# Patient Record
Sex: Female | Born: 1965 | Race: White | Hispanic: No | Smoking: Former smoker
Health system: Southern US, Community
[De-identification: ages and names within clinical notes are randomized; demographics above are authoritative.]

## PROBLEM LIST (undated history)

## (undated) DIAGNOSIS — E785 Hyperlipidemia, unspecified: Secondary | ICD-10-CM

## (undated) DIAGNOSIS — Z720 Tobacco use: Secondary | ICD-10-CM

## (undated) DIAGNOSIS — I493 Ventricular premature depolarization: Secondary | ICD-10-CM

## (undated) DIAGNOSIS — R600 Localized edema: Secondary | ICD-10-CM

## (undated) DIAGNOSIS — R002 Palpitations: Secondary | ICD-10-CM

## (undated) DIAGNOSIS — F17200 Nicotine dependence, unspecified, uncomplicated: Secondary | ICD-10-CM

## (undated) DIAGNOSIS — A630 Anogenital (venereal) warts: Secondary | ICD-10-CM

## (undated) DIAGNOSIS — I10 Essential (primary) hypertension: Secondary | ICD-10-CM

## (undated) HISTORY — PX: OTHER SURGICAL HISTORY: SHX169

## (undated) HISTORY — DX: Tobacco use: Z72.0

## (undated) HISTORY — DX: Localized edema: R60.0

## (undated) HISTORY — DX: Essential (primary) hypertension: I10

## (undated) HISTORY — DX: Hyperlipidemia, unspecified: E78.5

## (undated) HISTORY — DX: Nicotine dependence, unspecified, uncomplicated: F17.200

## (undated) HISTORY — PX: DILATION AND CURETTAGE OF UTERUS: SHX78

## (undated) HISTORY — DX: Anogenital (venereal) warts: A63.0

## (undated) HISTORY — DX: Palpitations: R00.2

## (undated) HISTORY — DX: Ventricular premature depolarization: I49.3

---

## 2000-09-05 ENCOUNTER — Other Ambulatory Visit: Admission: RE | Admit: 2000-09-05 | Discharge: 2000-09-05 | Payer: Self-pay | Admitting: *Deleted

## 2000-09-12 ENCOUNTER — Encounter: Admission: RE | Admit: 2000-09-12 | Discharge: 2000-09-12 | Payer: Self-pay | Admitting: *Deleted

## 2000-10-17 ENCOUNTER — Encounter: Admission: RE | Admit: 2000-10-17 | Discharge: 2000-10-17 | Payer: Self-pay | Admitting: *Deleted

## 2000-10-19 ENCOUNTER — Ambulatory Visit (HOSPITAL_BASED_OUTPATIENT_CLINIC_OR_DEPARTMENT_OTHER): Admission: RE | Admit: 2000-10-19 | Discharge: 2000-10-19 | Payer: Self-pay | Admitting: Otolaryngology

## 2001-08-04 ENCOUNTER — Ambulatory Visit (HOSPITAL_BASED_OUTPATIENT_CLINIC_OR_DEPARTMENT_OTHER): Admission: RE | Admit: 2001-08-04 | Discharge: 2001-08-04 | Payer: Self-pay | Admitting: Otolaryngology

## 2001-10-02 ENCOUNTER — Other Ambulatory Visit: Admission: RE | Admit: 2001-10-02 | Discharge: 2001-10-02 | Payer: Self-pay | Admitting: Family Medicine

## 2001-10-04 ENCOUNTER — Encounter: Payer: Self-pay | Admitting: Family Medicine

## 2001-10-04 ENCOUNTER — Encounter: Admission: RE | Admit: 2001-10-04 | Discharge: 2001-10-04 | Payer: Self-pay | Admitting: Family Medicine

## 2001-11-23 ENCOUNTER — Encounter: Payer: Self-pay | Admitting: Gastroenterology

## 2001-11-23 ENCOUNTER — Encounter: Admission: RE | Admit: 2001-11-23 | Discharge: 2001-11-23 | Payer: Self-pay | Admitting: Gastroenterology

## 2001-11-27 ENCOUNTER — Encounter: Payer: Self-pay | Admitting: Gastroenterology

## 2001-11-27 ENCOUNTER — Encounter: Admission: RE | Admit: 2001-11-27 | Discharge: 2001-11-27 | Payer: Self-pay | Admitting: Gastroenterology

## 2002-10-29 ENCOUNTER — Other Ambulatory Visit: Admission: RE | Admit: 2002-10-29 | Discharge: 2002-10-29 | Payer: Self-pay | Admitting: Family Medicine

## 2003-06-01 ENCOUNTER — Emergency Department (HOSPITAL_COMMUNITY): Admission: EM | Admit: 2003-06-01 | Discharge: 2003-06-01 | Payer: Self-pay | Admitting: Emergency Medicine

## 2004-11-23 ENCOUNTER — Other Ambulatory Visit: Admission: RE | Admit: 2004-11-23 | Discharge: 2004-11-23 | Payer: Self-pay | Admitting: Family Medicine

## 2006-07-06 ENCOUNTER — Emergency Department (HOSPITAL_COMMUNITY): Admission: EM | Admit: 2006-07-06 | Discharge: 2006-07-06 | Payer: Self-pay | Admitting: Emergency Medicine

## 2006-12-26 ENCOUNTER — Encounter: Admission: RE | Admit: 2006-12-26 | Discharge: 2006-12-26 | Payer: Self-pay | Admitting: Family Medicine

## 2007-01-02 ENCOUNTER — Encounter: Admission: RE | Admit: 2007-01-02 | Discharge: 2007-01-02 | Payer: Self-pay | Admitting: Family Medicine

## 2007-05-29 ENCOUNTER — Encounter: Admission: RE | Admit: 2007-05-29 | Discharge: 2007-05-29 | Payer: Self-pay | Admitting: Family Medicine

## 2008-08-08 ENCOUNTER — Emergency Department (HOSPITAL_COMMUNITY): Admission: EM | Admit: 2008-08-08 | Discharge: 2008-08-08 | Payer: Self-pay | Admitting: Emergency Medicine

## 2009-04-14 ENCOUNTER — Encounter: Admission: RE | Admit: 2009-04-14 | Discharge: 2009-04-14 | Payer: Self-pay | Admitting: *Deleted

## 2009-10-13 ENCOUNTER — Other Ambulatory Visit: Admission: RE | Admit: 2009-10-13 | Discharge: 2009-10-13 | Payer: Self-pay | Admitting: *Deleted

## 2010-01-07 ENCOUNTER — Emergency Department (HOSPITAL_COMMUNITY)
Admission: EM | Admit: 2010-01-07 | Discharge: 2010-01-07 | Payer: Self-pay | Source: Home / Self Care | Admitting: Family Medicine

## 2010-02-15 ENCOUNTER — Encounter: Payer: Self-pay | Admitting: Family Medicine

## 2010-05-04 LAB — BASIC METABOLIC PANEL
CO2: 27 mEq/L (ref 19–32)
Chloride: 106 mEq/L (ref 96–112)
Glucose, Bld: 102 mg/dL — ABNORMAL HIGH (ref 70–99)
Sodium: 138 mEq/L (ref 135–145)

## 2010-05-04 LAB — POCT CARDIAC MARKERS
CKMB, poc: <1 ng/mL — ABNORMAL LOW (ref 1.0–8.0)
Myoglobin, poc: 36.5 ng/mL (ref 12–200)
Myoglobin, poc: 40.9 ng/mL (ref 12–200)
Troponin i, poc: <0.05 ng/mL (ref 0.00–0.09)
Troponin i, poc: <0.05 ng/mL (ref 0.00–0.09)

## 2010-05-04 LAB — DIFFERENTIAL
Basophils Absolute: 0 10*3/uL (ref 0.0–0.1)
Basophils Relative: 0 % (ref 0–1)
Eosinophils Absolute: 0 10*3/uL (ref 0.0–0.7)
Eosinophils Relative: 0 % (ref 0–5)
Lymphocytes Relative: 32 % (ref 12–46)
Lymphs Abs: 2.4 10*3/uL (ref 0.7–4.0)
Monocytes Relative: 8 % (ref 3–12)

## 2010-05-04 LAB — CBC
Hemoglobin: 13.4 g/dL (ref 12.0–15.0)
RBC: 4.19 MIL/uL (ref 3.87–5.11)

## 2010-05-04 LAB — GLUCOSE, CAPILLARY: Glucose-Capillary: 95 mg/dL (ref 70–99)

## 2010-05-04 LAB — PROTIME-INR: INR: 1.1 (ref 0.00–1.49)

## 2010-05-04 LAB — URINALYSIS, ROUTINE W REFLEX MICROSCOPIC
Glucose, UA: NEGATIVE mg/dL
Hgb urine dipstick: NEGATIVE
Protein, ur: NEGATIVE mg/dL

## 2010-05-04 LAB — D-DIMER, QUANTITATIVE: D-Dimer, Quant: <0.22 ug/mL-FEU (ref 0.00–0.48)

## 2010-06-12 NOTE — Op Note (Signed)
[place]  Patient:    Barbara Rosario, Barbara Rosario Visit Number: [id number] MRN: [id number]          Service Type: DSU Location: [facility] Attending Physician:  Carlean Purl Dictated by:   Kristine Garbe Ezzard Standing, M.D. Proc. Date: 08/04/01 Admit Date:  08/04/2001 Discharge Date: 08/04/2001   CC:         Lilia Pro, M.D.   Operative Report  PREOPERATIVE DIAGNOSIS:  Right ear attic cholesteatoma.  POSTOPERATIVE DIAGNOSIS:  Right ear attic cholesteatoma.  OPERATION PERFORMED:  Right tympanoplasty with atticotomy with removal of cholesteatoma.  SURGEON:  Sandria Bales. Ezzard Standing, M.D.  ANESTHESIA:  General endotracheal.  COMPLICATIONS:  None.  INDICATIONS FOR PROCEDURE:  The patient is a 45 year old female who has had chronic problems with intermittent drainage from her right eye.  On examination, she has a right ear attic cholesteatoma with some granulation tissue and squamous debris.  She is taken to the operating room at this time for right tympanoplasty, possible mastoidectomy, possible atticotomy.  FINDINGS:  The patient had a small right ear attic cholesteatoma with minimal damage to ossicles.  DESCRIPTION OF PROCEDURE:  After adequate endotracheal anesthesia, the patient received 1 gm Ancef IV preoperatively.  The right ear was prepped with Betadine solution and draped out with sterile towels.  Ear canal postauricular area was injected with 6 cc of Xylocaine with epinephrine for hemostasis. Following this, the ear canal was examined and was irrigated with saline.  The patient had some debris in the superior aspect of the ear canal which was cleaned.  The patient had a pocket with some squamous debris and granulation tissue in the attic area.  A posterior superior tympanomeatal flap was elevated down to the annulus.  For better visualization, a postauricular incision was made and the ear was reflected anteriorly.  The temporalis  fascia graft was harvested and set aside to dry.  The tympanomeatal flap was then elevated down to the annulus and the middle ear space was entered.  The inferior middle ear space was dry with no significant debris, no drainage and no evidence of cholesteatoma within the middle ear.  Elevation was then carried out superiorly.  The chorda tympani was identified and a pocket of cholesteatoma and squamous debris was superior to the chorda tympani up to the attic area.  The incus and stapes superstructure were identified and were mobile.  There was no cholesteatoma extending down to the long process of the incus.  However, there was a pocket lying on top of the head of the incus and malleus.  Some of the sputum and attic bone were removed to provide better exposure of the cholesteatoma.  The cholesteatoma was fairly small and limited to the attic area and limited to the lateral aspect to the head of the malleus and incus.  After removing some of the rim of the sputum and attic bone, I was able to totally excise the cholesteatoma and squamous pocket through a limited atticotomy.  After removing the cholesteatoma, I was able to visualize the entire head of the incus and malleus.  There was no squamous debris extending anterior to the head of the malleus and the incus and malleus head were still attached and freely mobile.  The tympanic membrane was reflected to expose the anterior chamber of the middle ear space and this was clear of any debris or any cholesteatoma.  The cholesteatoma was dissected off of the short process follow-up the malleus.  To refill in the  atticotomy and bone that was removed, cartilage was harvested and placed in the attic defect.  The temporalis fascia graft was then cut to appropriate size and placed over the attic defect over the cartilage and medial to the superior tympanomeatal flap.  The tympanomeatal flap was then brought back down covering the fascia graft  which as placed medial to the tympanomeatal flap.  The tympanomeatal canal was then packed with Gelfoam soaked in Coly-Mycin.  The postauricular incision was then closed with 2-0 chromic suture subcutaneously and 5-0 nylon through the skin. A mastoid dressing was applied.  The patient was subsequently awakened from anesthesia and transferred to the recovery room postoperatively doing well.  DISPOSITION:  Barbara Rosario will be discharged home later this morning on Keflex 500 mg b.i.d. for one week and either Darvocet or Vicodin for pain.  Will have her follow up in my office in one week for recheck.  She will remove the mastoid dressing tomorrow morning and change the cotton ball in her ear. Dictated by:   Kristine Garbe Ezzard Standing, M.D. Attending Physician:  Carlean Purl DD:  08/04/01 TD:  08/07/01 Job: [id number]

## 2010-09-30 ENCOUNTER — Emergency Department (HOSPITAL_COMMUNITY)
Admission: EM | Admit: 2010-09-30 | Discharge: 2010-10-01 | Disposition: A | Discharge status: Home / Self Care | Payer: BC Managed Care – PPO | Attending: Emergency Medicine | Admitting: Emergency Medicine

## 2010-09-30 DIAGNOSIS — R002 Palpitations: Secondary | ICD-10-CM | POA: Insufficient documentation

## 2010-09-30 DIAGNOSIS — R079 Chest pain, unspecified: Secondary | ICD-10-CM | POA: Insufficient documentation

## 2010-09-30 DIAGNOSIS — IMO0002 Reserved for concepts with insufficient information to code with codable children: Secondary | ICD-10-CM | POA: Insufficient documentation

## 2010-09-30 DIAGNOSIS — Z79899 Other long term (current) drug therapy: Secondary | ICD-10-CM | POA: Insufficient documentation

## 2010-10-01 LAB — POCT I-STAT, CHEM 8
BUN: 18 mg/dL (ref 6–23)
Creatinine, Ser: 0.8 mg/dL (ref 0.50–1.10)
Glucose, Bld: 96 mg/dL (ref 70–99)
HCT: 42 % (ref 36.0–46.0)
Potassium: 3.5 mEq/L (ref 3.5–5.1)

## 2010-10-01 LAB — D-DIMER, QUANTITATIVE: D-Dimer, Quant: 0.32 ug/mL-FEU (ref 0.00–0.48)

## 2010-10-01 LAB — CBC
HCT: 39.5 % (ref 36.0–46.0)
MCV: 88 fL (ref 78.0–100.0)
Platelets: 243 10*3/uL (ref 150–400)
RBC: 4.49 MIL/uL (ref 3.87–5.11)
WBC: 9.5 10*3/uL (ref 4.0–10.5)

## 2011-11-10 ENCOUNTER — Encounter: Payer: Self-pay | Admitting: Family Medicine

## 2011-12-18 ENCOUNTER — Ambulatory Visit (INDEPENDENT_AMBULATORY_CARE_PROVIDER_SITE_OTHER): Payer: BC Managed Care – PPO | Admitting: Family Medicine

## 2011-12-18 VITALS — BP 136/85 | HR 82 | Temp 98.3°F | Resp 16 | Ht 64.75 in | Wt 189.4 lb

## 2011-12-18 DIAGNOSIS — S0500XA Injury of conjunctiva and corneal abrasion without foreign body, unspecified eye, initial encounter: Secondary | ICD-10-CM

## 2011-12-18 MED ORDER — DICLOFENAC SODIUM 0.1 % OP SOLN: 1.0000 [drp] | Freq: Four times a day (QID) | OPHTHALMIC | Status: DC

## 2011-12-18 MED ORDER — POLYMYXIN B-TRIMETHOPRIM 10000-0.1 UNIT/ML-% OP SOLN: 1.0000 [drp] | OPHTHALMIC | Status: DC

## 2011-12-18 NOTE — Patient Instructions (Signed)
Corneal Abrasion  The cornea is the clear covering at the front and center of the eye. When looking at the colored portion (iris) of the eye, you are looking through that person's cornea.   This very thin tissue is made up of many layers. The surface layer is a single layer of cells called the corneal epithelium. This is one of the most sensitive tissues in the body. If a scratch or injury causes the corneal epithelium to come off, it is called a corneal abrasion. If the injury extends to the tissues below the epithelium, the condition is called a corneal ulcer.   CAUSES    Scratches.   Trauma.   Foreign body in the eye.   Some people have recurrences of abrasions in the area of the original injury even after they heal. This is called recurrent erosion syndrome. Recurrent erosion syndromes generally improve and go away with time.  SYMPTOMS    Eye pain.   Difficulty or inability to keep the injured eye open.   The eye becomes very sensitive to light.   Recurrent erosions tend to happen suddenly, first thing in the morning  usually upon awakening and opening the eyes.  DIAGNOSIS   Your eye professional can diagnose a corneal abrasion during an eye exam. Dye is usually placed in the eye using a drop or a small paper strip moistened by the patient's tears. When the eye is examined with a special light, the abrasion shows up clearly because of the dye.  TREATMENT    Small abrasions may be treated with antibiotic drops or ointment alone.   Usually a pressure patch is specially applied. Pressure patches prevent the eye from blinking, allowing the corneal epithelium to heal. Because blinking is less, a pressure patch also reduces the amount of pain present in the eye during healing. Most corneal abrasions heal within 2-3 days with no effect on vision. WARNING: Do not drive or operate machinery while your eye is patched. Your ability to judge distances is impaired.   If abrasion becomes infected and spreads to the  deeper tissues of the cornea, a corneal ulcer can result. This is serious because it can cause corneal scarring. Corneal scars interfere with light passing through the cornea, and cause a loss of vision in the involved eye.   If your caregiver has given you a follow-up appointment, it is very important to keep that appointment. Not keeping the appointment could result in a severe eye infection or permanent loss of vision. If there is any problem keeping the appointment, you must call back to this facility for assistance.  SEEK MEDICAL CARE IF:    You have pain, light sensitivity and a scratchy feeling in one eye (or both).   Your pressure patch keeps loosening up and you can blink your eye under the patch after treatment.   Any kind of discharge develops from the involved eye after treatment or if the lids stick together in the morning.   You have the same symptoms in the morning as you did with the original abrasion days, weeks or months after the abrasion healed.  MAKE SURE YOU:    Understand these instructions.   Will watch your condition.   Will get help right away if you are not doing well or get worse.  Document Released: 01/09/2000 Document Revised: 04/05/2011 Document Reviewed: 08/17/2007  ExitCare Patient Information 2013 [employer].

## 2011-12-18 NOTE — Progress Notes (Signed)
  Subjective:    Patient ID: Barbara Rosario, female    DOB: 07/04/1965, 46 y.o.   MRN: [id number] Chief Complaint  Patient presents with  . Eye Pain    x last night left     HPI  Had a corneal abrasion about 20 yrs ago.  Was fine last night but woke up this morning with left eye watering and painful w/ blinking so assumes she got something in it last night while she was sleeping but no known injuries.  Left eye very blurry. Vision is bad at baseline - has new glasses coming in next wk.    Review of Systems    BP 136/85  Pulse 82  Temp(Src) 98.3 F (36.8 C) (Oral)  Resp 16  Ht 5' 4.75" (1.645 m)  Wt 189 lb 6.4 oz (85.911 kg)  BMI 31.75 kg/m2  SpO2 99%  LMP 11/20/2011 Objective:   Physical Exam        Assessment & Plan:  Conjunctival abrasion - Plan: trimethoprim-polymyxin b (POLYTRIM) ophthalmic solution, diclofenac (VOLTAREN) 0.1 % ophthalmic solution  Meds ordered this encounter  Medications  . trimethoprim-polymyxin b (POLYTRIM) ophthalmic solution    Sig: Place 1 drop into the left eye every 4 (four) hours.    Dispense:  10 mL    Refill:  0  . diclofenac (VOLTAREN) 0.1 % ophthalmic solution    Sig: Place 1 drop into the left eye 4 (four) times daily.    Dispense:  5 mL    Refill:  0

## 2012-04-03 ENCOUNTER — Other Ambulatory Visit (HOSPITAL_COMMUNITY)
Admission: RE | Admit: 2012-04-03 | Discharge: 2012-04-03 | Disposition: A | Discharge status: Home / Self Care | Payer: BC Managed Care – PPO | Source: Ambulatory Visit | Attending: Family Medicine | Admitting: Family Medicine

## 2012-04-03 ENCOUNTER — Other Ambulatory Visit: Payer: Self-pay | Admitting: Family Medicine

## 2012-04-03 DIAGNOSIS — Z0000 Encounter for general adult medical examination without abnormal findings: Secondary | ICD-10-CM | POA: Insufficient documentation

## 2012-06-26 ENCOUNTER — Ambulatory Visit: Payer: BC Managed Care – PPO | Admitting: Internal Medicine

## 2012-12-25 ENCOUNTER — Ambulatory Visit (INDEPENDENT_AMBULATORY_CARE_PROVIDER_SITE_OTHER): Payer: BC Managed Care – PPO | Admitting: Neurology

## 2012-12-25 ENCOUNTER — Encounter: Payer: Self-pay | Admitting: Neurology

## 2012-12-25 VITALS — BP 140/78 | HR 80 | Temp 98.1°F | Ht 65.0 in | Wt 204.0 lb

## 2012-12-25 DIAGNOSIS — R739 Hyperglycemia, unspecified: Secondary | ICD-10-CM

## 2012-12-25 DIAGNOSIS — R209 Unspecified disturbances of skin sensation: Secondary | ICD-10-CM

## 2012-12-25 DIAGNOSIS — R7309 Other abnormal glucose: Secondary | ICD-10-CM

## 2012-12-25 DIAGNOSIS — R202 Paresthesia of skin: Secondary | ICD-10-CM

## 2012-12-25 DIAGNOSIS — G501 Atypical facial pain: Secondary | ICD-10-CM

## 2012-12-25 NOTE — Patient Instructions (Addendum)
1.  I will get the MRI scheduled.  Call me for the results.  If they are essentially unremarkable, then we will go ahead and start the medication as we discussed. 2.  MRI is scheduled for Wednesday December 3rd at 1:45pm at [facility] entrance A first floor Radiology. #[phone]

## 2012-12-25 NOTE — Progress Notes (Signed)
Subjective:    Barbara Rosario was seen in consultation in the movement disorder clinic at the request of Allean Found, MD.  The evaluation is for tremor.  However, she has multiple other c/o, including head pain and facial paresthesias.  She is accompanied by her female partner who supplements the hx.  The patient is a 47 y.o. right handed female with a history of tremor.  The tremor is in both hands and if she concentrates on something, there is some head "bob" that she doesn't notice. She states that it doesn't interfere with ADL's including grooming.  She really doesn't notice it unless she holds her hands outright.   There is no family hx of tremor.    Affected by caffeine:  no Affected by alcohol:  no Affected by stress:  no Affected by fatigue:  no Spills soup if on spoon:  no Spills glass of liquid if full:  no Affects ADL's (tying shoes, brushing teeth, etc):  no  Her biggest c/o is an electric shock like sensation x 1 year.  It is across the forehead. It is getting worse/more frequent.   About a month ago, she had one of these sensations across the forehead that was more cramp like and she ended up with paresthesias over a pinpoint area in the R forehead.  She had another a few weeks ago, but the pain seemed more intense.  She states that the pinpoint area is sore to the touch and involves a burning sensation that seemed to progress over the R face.  These episodes may happen 2 times per day or once a week or even 1 time every 2 months.  There is no association with stress or weather changes.  She notes that concentration on something will set it off.  Not wearing glasses may set it off.  She had her eyes examined in November and things looked okay.   Outside reports reviewed: historical medical records, office notes and referral letter/letters.  Allergies  Allergen Reactions  . Codeine Nausea And Vomiting  . Sulfa Antibiotics Nausea And Vomiting    Current Outpatient  Prescriptions on File Prior to Visit  Medication Sig Dispense Refill  . diclofenac (VOLTAREN) 0.1 % ophthalmic solution Place 1 drop into the left eye 4 (four) times daily.  5 mL  0  . trimethoprim-polymyxin b (POLYTRIM) ophthalmic solution Place 1 drop into the left eye every 4 (four) hours.  10 mL  0   No current facility-administered medications on file prior to visit.    No past medical history on file.  Past Surgical History  Procedure Laterality Date  . Mastiotympanostomy    . Nasal polyps      History   Social History  . Marital Status: Single    Spouse Name: N/A    Number of Children: N/A  . Years of Education: N/A   Occupational History  . Not on file.   Social History Main Topics  . Smoking status: Current Every Day Smoker  . Smokeless tobacco: Not on file  . Alcohol Use: No  . Drug Use: Not on file  . Sexual Activity: Yes    Birth Control/ Protection: None   Other Topics Concern  . Not on file   Social History Narrative  . No narrative on file    Family Status  Relation Status Death Age  . Father Deceased   . Maternal Grandmother Deceased   . Maternal Grandfather Deceased   . Paternal Grandmother Deceased   .  Paternal Grandfather Deceased     Review of Systems A complete 10 system ROS was obtained and was negative apart from what is mentioned.   Objective:   VITALS:   Filed Vitals:   12/25/12 0802  BP: 140/78  Pulse: 80  Temp: 98.1 F (36.7 C)  TempSrc: Oral  Height: 5\' 5"  (1.651 m)  Weight: 204 lb (92.534 kg)   Gen:  Appears stated age and in NAD. HEENT:  Normocephalic, atraumatic. The mucous membranes are moist. The superficial temporal arteries are without ropiness or tenderness. Cardiovascular: Regular rate and rhythm. Lungs: Clear to auscultation bilaterally. Neck: There are no carotid bruits noted bilaterally.  NEUROLOGICAL:  Orientation:  The patient is alert and oriented x 3.  Recent and remote memory are intact.  Attention  span and concentration are normal.  Able to name objects and repeat without trouble.  Fund of knowledge is appropriate Cranial nerves: There is good facial symmetry. The pupils are equal round and reactive to light bilaterally. Fundoscopic exam reveals clear disc margins bilaterally. Extraocular muscles are intact and visual fields are full to confrontational testing. Speech is fluent and clear. Soft palate rises symmetrically and there is no tongue deviation. Hearing is intact to conversational tone. Tone: Tone is good throughout. Sensation: Sensation is intact to light touch and pinprick throughout (facial, trunk, extremities).  However, she reports  decreased to no pinprick on the right forehead compared to the left, and decreased pinprick over the right lower face compared to the left.  There is vibrational splitting, in which she reports decreased vibration over the right forehead compared to the left when the tuning fork is held central.  Vibration is intact at the bilateral big toe. There is no extinction with double simultaneous stimulation. There is no sensory dermatomal level identified. Coordination:  The patient has no dysdiadichokinesia or dysmetria. Motor: Strength is 5/5 in the bilateral upper and lower extremities.  Shoulder shrug is equal bilaterally.  There is no pronator drift.  There are no fasciculations noted. DTR's: Deep tendon reflexes are 2/4 at the bilateral biceps, triceps, brachioradialis, patella and achilles.  Plantar responses are downgoing bilaterally. Gait and Station: The patient is able to ambulate without difficulty. The patient is able to heel toe walk without any difficulty. The patient is able to ambulate in a tandem fashion. The patient is able to stand in the Romberg position.   MOVEMENT EXAM: Tremor:  There is no tremor at rest.  There is virtually no tremor of the outstretched hands.  There is no intention tremor.  She has no difficulty with Archimedes spirals.       Assessment/Plan:   1.  facial pain and paresthesias.  -While I do think that there may be a component atypical face pain there are also some nonphysiologic features on examination (vibrational splitting).  -We will do an MRI of the brain  -We will do lab work, to include a sedimentation rate and hemoglobin A1c, just because her glucose was high (minimally) on her PCP lab work.  All other labs done by her primary care doctor in November including her CBC, liver functions and electrolytes and TSH were normal.  Her cholesterol was elevated at 248.  -This results are unremarkable, we decided to try her on Trileptal for a few months and see how she does.  She is to call me after the results of her MRI, as this is easier than her coming back for a visit. She will need a sodium drawn  about 2 weeks after starting the Trileptal, and then I will plan on seeing her back in at least 3 months. 2.  Tremor  -I. saw virtually no tremor today, and she reports that tremor is not bothersome to her.  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

## 2012-12-27 ENCOUNTER — Other Ambulatory Visit (HOSPITAL_COMMUNITY): Payer: BC Managed Care – PPO

## 2012-12-28 ENCOUNTER — Ambulatory Visit (HOSPITAL_COMMUNITY)
Admission: RE | Admit: 2012-12-28 | Discharge: 2012-12-28 | Disposition: A | Discharge status: Home / Self Care | Payer: BC Managed Care – PPO | Source: Ambulatory Visit | Attending: Neurology | Admitting: Neurology

## 2012-12-28 DIAGNOSIS — R739 Hyperglycemia, unspecified: Secondary | ICD-10-CM

## 2012-12-28 DIAGNOSIS — G501 Atypical facial pain: Secondary | ICD-10-CM

## 2012-12-28 DIAGNOSIS — G9389 Other specified disorders of brain: Secondary | ICD-10-CM | POA: Insufficient documentation

## 2012-12-28 DIAGNOSIS — R51 Headache: Secondary | ICD-10-CM | POA: Insufficient documentation

## 2012-12-28 DIAGNOSIS — R202 Paresthesia of skin: Secondary | ICD-10-CM

## 2012-12-28 MED ORDER — GADOBENATE DIMEGLUMINE 529 MG/ML IV SOLN
19.0000 mL | Freq: Once | INTRAVENOUS | Status: AC | PRN
  Administered 2012-12-28: 19 mL via INTRAVENOUS

## 2012-12-29 ENCOUNTER — Other Ambulatory Visit: Payer: Self-pay | Admitting: Neurology

## 2012-12-29 MED ORDER — OXCARBAZEPINE 150 MG PO TABS: ORAL_TABLET | ORAL | Status: DC

## 2012-12-29 NOTE — Telephone Encounter (Signed)
Called pt and relayed your message. She is going to start taking the Trileptal on Dec. 15th and her lab appt is scheduled for Dec. 29th at 10am. Pt wants to know when lab work will need to be done again because [place] will [place] of [place] from [place]. And what are you checking for with the labs? Also pt would like more details about her MRI results. What do you mean by "nothing new" because she has never has an MRI before?

## 2012-12-29 NOTE — Telephone Encounter (Signed)
Tell pt that MRI did not show anything new.  Ask if she wants to start the trileptal that we talked about.  If so, send in RX for Trileptal 150 mg - 1 po bid x 1 week, then 2 po bid thereafter.  She needs to come in for BMP 2 weeks after starting the med (put on lab schedule) and needs f/u in 3 months.

## 2013-01-09 ENCOUNTER — Telehealth: Payer: Self-pay

## 2013-01-09 NOTE — Telephone Encounter (Signed)
Pt called and would like to speak to Dr.Tat about her MRI results and about holding off on new med until she gets back from her trip in mid February.

## 2013-01-10 ENCOUNTER — Telehealth: Payer: Self-pay

## 2013-01-10 NOTE — Telephone Encounter (Signed)
Called pt back and relayed your message. She doesn't have anymore questions. She will start new med when she returns from trip and will call back to schedule follow up.

## 2013-01-10 NOTE — Telephone Encounter (Signed)
Her MRI was fine.  She can wait to start medication, just move follow up back

## 2013-01-10 NOTE — Telephone Encounter (Signed)
I just wanted to make sure that I wasn't missing anything.  There is nothing in the brain that looks abnormal to explain her symptoms.

## 2013-01-10 NOTE — Telephone Encounter (Signed)
Called pt and relayed your message. She still wants you to call her because she wants your explanation about why you had her get an MRI and what you were thinking could have been found on the MRI to explain her symptoms.

## 2013-01-22 ENCOUNTER — Other Ambulatory Visit: Payer: BC Managed Care – PPO

## 2013-03-23 ENCOUNTER — Telehealth: Payer: Self-pay | Admitting: Neurology

## 2013-03-23 NOTE — Telephone Encounter (Signed)
Called patient back- she never started Trileptal. She does not wish to start on a medication. She is controlling symptoms with tylenol and acupuncture. She wishes to cancel her follow up at this time and will call us back as needed.

## 2013-03-23 NOTE — Telephone Encounter (Signed)
Patient returning your call.

## 2013-03-23 NOTE — Telephone Encounter (Signed)
Left message on machine for patient to call back. To see if she ever started her Trileptal medication. If not - follow up should be pushed back a couple months. Awaiting call back.

## 2013-03-26 ENCOUNTER — Ambulatory Visit: Payer: BC Managed Care – PPO | Admitting: Neurology

## 2013-06-13 ENCOUNTER — Ambulatory Visit (INDEPENDENT_AMBULATORY_CARE_PROVIDER_SITE_OTHER): Payer: BC Managed Care – PPO | Admitting: Family Medicine

## 2013-06-13 VITALS — BP 138/78 | HR 101 | Temp 98.7°F | Resp 16 | Ht 64.0 in | Wt 200.0 lb

## 2013-06-13 DIAGNOSIS — E785 Hyperlipidemia, unspecified: Secondary | ICD-10-CM

## 2013-06-13 DIAGNOSIS — R079 Chest pain, unspecified: Secondary | ICD-10-CM

## 2013-06-13 DIAGNOSIS — M25519 Pain in unspecified shoulder: Secondary | ICD-10-CM

## 2013-06-13 DIAGNOSIS — M531 Cervicobrachial syndrome: Secondary | ICD-10-CM

## 2013-06-13 DIAGNOSIS — M5481 Occipital neuralgia: Secondary | ICD-10-CM

## 2013-06-13 MED ORDER — DICLOFENAC SODIUM 1 % TD GEL: 2.0000 g | Freq: Four times a day (QID) | TRANSDERMAL | Status: DC

## 2013-06-13 NOTE — Progress Notes (Addendum)
Subjective:    Patient ID: Barbara Rosario, female    DOB: 01-27-1965, 48 y.o.   MRN: [id number]  Headache  Associated symptoms include dizziness and neck pain.  Neck Pain  Associated symptoms include headaches.  Dizziness Associated symptoms include headaches, myalgias and neck pain.  Sinusitis Associated symptoms include headaches and neck pain.   Chief Complaint  Patient presents with   Headache   Neck Pain    left side    Dizziness   Sinusitis   This chart was scribed for Elvina SidleKurt Lauenstein, MD by Andrew Auaven Small, ED Scribe. This patient was seen in room 11 and the patient's care was started at 3:24 PM.  HPI Comments: Barbara Rosario is a 48 y.o. female who presents to the [facility] complaining of left sided neck pain onset 2 weeks. . Pt reports pain worsens with movement and exertion. 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 She reports shooting pain that started on left side of her neck and radiated down her left arm. Pt felt as if she was going to pass out at work.  Pt is gluten free and recently had a grilled chicken sandwhich from [employer] that may have contained gluten   Pt also c/o constant right occipital HA that radiates to the top and front of her head onset 3 days. Pt states that this is not a new issue. She reports similar symptoms in the past. Pt was seen by a neurologist. Pt still is unsure where the pain comes from.  Patient had a surgical procedure on mastoid about 15 years ago.  She finds that the right occipital pain is relieved by percogesic.  History reviewed. No pertinent past medical history. Allergies  Allergen Reactions   Codeine Nausea And Vomiting   Sulfa Antibiotics Nausea And Vomiting   Prior to Admission medications   Medication Sig Start Date End Date Taking? Authorizing Provider  atorvastatin (LIPITOR) 20 MG tablet  12/14/12   Historical Provider, MD  OXcarbazepine  (TRILEPTAL) 150 MG tablet Take one tablet twice a day for one week, then take two tablets twice a day thereafter. 12/29/12   Octaviano Battyebecca S Tat, DO   Review of Systems  Musculoskeletal: Positive for myalgias and neck pain.  Neurological: Positive for dizziness and headaches.       Objective:   Physical Exam  Nursing note and vitals reviewed. Constitutional: She is oriented to person, place, and time. She appears well-developed and well-nourished. No distress.  HENT:  Head: Normocephalic and atraumatic.  Eyes: EOM are normal.  Neck: Neck supple. No tracheal deviation present.  Cardiovascular: Regular rhythm and normal heart sounds.  Tachycardia present.   Pulmonary/Chest: Effort normal. No respiratory distress.  Musculoskeletal: Normal range of motion. She exhibits no tenderness.  Neurological: She is alert and oriented to person, place, and time.  Skin: Skin is warm and dry.  Psychiatric: She has a normal mood and affect. Her behavior is normal.   EKG:  NSR    Assessment & Plan:   1. Hyperlipidemia   2. Chest pain    Patient has 2 different symptoms: First is what appears to be an occipital neuralgia. This usually responds to nonsteroidals. The second problem appears to be a sudden neck spasm that radiated to the left side of the face and into the left arm and left anterior chest. This is resolved and left no residual symptoms and the cardiogram is  normal.  I would encourage the patient to try nonsteroidals although she is reluctant to take prescription medicine. I do not see a serious issue here and unwilling to be as reassuring as possible to the patient  Hyperlipidemia  Chest pain - Plan: EKG 12-Lead  Shoulder pain  Occipital neuralgia - Plan: diclofenac sodium (VOLTAREN) 1 % GEL    Signed, Elvina SidleKurt Lauenstein M.D.

## 2013-06-13 NOTE — Patient Instructions (Signed)
Occipital Neuralgia Neuralgias are attacks of sharp stabbing pain. They may be intermittent (comes and goes) or constant in nature. They may be brief attacks that last seconds to minutes and may come back for days to weeks. The neuralgias can occur as a result of a herpes zoster (shingles), chickenpox infection, or even following a herpes simplex infection (cold sore). TYPES OF NEURALGIA  When these pains are located in the back of the head and neck they are called occipital neuralgias.  When the pain is located between ribs it is called intercostal neuralgia.  When the pain is located in the face it is called trigeminal neuralgia. This is the most common neuralgia. It causes sharp, shock like pain on one side of your face. The neuralgias, which follow herpes zoster infections, often produce a constant burning pain. They may last from weeks to months and even years. The attacks of pain may come from injury or inflammation (irritation) to a nerve. Often the cause is unknown. The episodes of pain may be caused by light touch, movement, or even eating and sneezing. Usually these neuralgias occur after age forty. The neuralgias following shingles and trigeminal neuralgia are the most common. Although painful, these episodes do not threaten life and tend to lessen as we grow older. TREATMENT  There are many medications that may be helpful in the treatment of this disorder. Sometimes several medications may have to be tried before the right combination can be found for you. Some of these medications are:  Only take over-the-counter or prescription medications for pain, discomfort, or fever as directed by your caregiver.  Narcotic medications may be used to control the pain.  Antidepressants and medications used in epilepsy (seizure disorders) may be useful. LET YOUR CAREGIVER KNOW ABOUT:  If you do not obtain relief from medications.  Problems that are getting worse rather than better.  Troubling  side effects that you think are coming from the medication. Do not be discouraged if you do not obtain instant relief from the medications or help given you. Your caregiver can help you get through these episodes of pain with some persistence (continued trying) on your part also. Document Released: 01/05/2001 Document Revised: 04/05/2011 Document Reviewed: 01/11/2005 ExitCare Patient Information 2014 [employer].  

## 2013-06-15 ENCOUNTER — Telehealth: Payer: Self-pay

## 2013-06-15 NOTE — Telephone Encounter (Signed)
PA needed for voltaren gel. Pt reported she has tried ibuprofen, aleve, aspirin and it has been ineffective. Completed form on covermymeds.

## 2013-06-21 NOTE — Telephone Encounter (Signed)
PA denied bc Voltaren gel is not covered for use on the spine, hip or shoulder. Dr L, do you want to Rx anything else?

## 2013-06-29 ENCOUNTER — Other Ambulatory Visit (HOSPITAL_COMMUNITY): Payer: Self-pay | Admitting: Family Medicine

## 2013-06-29 ENCOUNTER — Ambulatory Visit (HOSPITAL_COMMUNITY)
Admission: RE | Admit: 2013-06-29 | Discharge: 2013-06-29 | Disposition: A | Discharge status: Home / Self Care | Payer: BC Managed Care – PPO | Source: Ambulatory Visit | Attending: Family Medicine | Admitting: Family Medicine

## 2013-06-29 ENCOUNTER — Other Ambulatory Visit (HOSPITAL_COMMUNITY): Payer: Self-pay | Admitting: Unknown Physician Specialty

## 2013-06-29 DIAGNOSIS — M7989 Other specified soft tissue disorders: Secondary | ICD-10-CM

## 2013-06-29 DIAGNOSIS — Z79890 Hormone replacement therapy (postmenopausal): Secondary | ICD-10-CM | POA: Insufficient documentation

## 2013-06-29 DIAGNOSIS — M79609 Pain in unspecified limb: Secondary | ICD-10-CM

## 2013-06-29 DIAGNOSIS — F17200 Nicotine dependence, unspecified, uncomplicated: Secondary | ICD-10-CM | POA: Insufficient documentation

## 2013-06-29 NOTE — Progress Notes (Signed)
*  PRELIMINARY RESULTS* Vascular Ultrasound Left lower extremity venous duplex has been completed.  Preliminary findings: no evidence of DVT or baker's cyst.  Called results to [place].   Farrel Demark, RDMS, RVT  06/29/2013, 12:28 PM

## 2013-07-26 ENCOUNTER — Emergency Department (HOSPITAL_COMMUNITY): Payer: BC Managed Care – PPO

## 2013-07-26 ENCOUNTER — Encounter (HOSPITAL_COMMUNITY): Payer: Self-pay | Admitting: Emergency Medicine

## 2013-07-26 DIAGNOSIS — F17200 Nicotine dependence, unspecified, uncomplicated: Secondary | ICD-10-CM | POA: Insufficient documentation

## 2013-07-26 DIAGNOSIS — J40 Bronchitis, not specified as acute or chronic: Secondary | ICD-10-CM | POA: Insufficient documentation

## 2013-07-26 DIAGNOSIS — Z79899 Other long term (current) drug therapy: Secondary | ICD-10-CM | POA: Insufficient documentation

## 2013-07-26 DIAGNOSIS — R071 Chest pain on breathing: Secondary | ICD-10-CM | POA: Insufficient documentation

## 2013-07-26 NOTE — ED Notes (Signed)
Pt. reports persistent dry cough with left lateral ribcage pain worse with deep inspiration and when lying . Denies fever or chills.

## 2013-07-27 ENCOUNTER — Emergency Department (HOSPITAL_COMMUNITY)
Admission: EM | Admit: 2013-07-27 | Discharge: 2013-07-27 | Disposition: A | Discharge status: Home / Self Care | Payer: BC Managed Care – PPO | Attending: Emergency Medicine | Admitting: Emergency Medicine

## 2013-07-27 DIAGNOSIS — J40 Bronchitis, not specified as acute or chronic: Secondary | ICD-10-CM

## 2013-07-27 MED ORDER — ALBUTEROL SULFATE HFA 108 (90 BASE) MCG/ACT IN AERS
2.0000 | INHALATION_SPRAY | RESPIRATORY_TRACT | Status: DC | PRN
  Administered 2013-07-27: 2 via RESPIRATORY_TRACT
  Filled 2013-07-27: qty 6.7

## 2013-07-27 MED ORDER — HYDROCODONE-ACETAMINOPHEN 5-325 MG PO TABS: 1.0000 | ORAL_TABLET | Freq: Four times a day (QID) | ORAL | Status: DC | PRN

## 2013-07-27 MED ORDER — AEROCHAMBER PLUS W/MASK MISC
1.0000 | Freq: Once | Status: AC
  Administered 2013-07-27: 1
  Filled 2013-07-27: qty 1

## 2013-07-27 NOTE — ED Notes (Signed)
MD at bedside. 

## 2013-07-27 NOTE — ED Provider Notes (Signed)
CSN: [id number]     Arrival date & time 07/26/13  2228 History   First MD Initiated Contact with Patient 07/27/13 0034     Chief Complaint  Patient presents with  . Cough     (Consider location/radiation/quality/duration/timing/severity/associated sxs/prior Treatment) HPI Complaint of cough for the past 4 weeks, improving. Also complains of left lateral chest pain at mid axillary line. Worse with changing positions or deep inspiration or cough for approximately the past week. He appears up with albuterol tonight, without relief. No other associated symptoms no fever. Treated with ibuprofen tonight 100 mg, without relief. History reviewed. No pertinent past medical history. Past Surgical History  Procedure Laterality Date  . Mastiotympanostomy    . Nasal polyps     Family History  Problem Relation Age of Onset  . Cancer Father    History  Substance Use Topics  . Smoking status: Current Every Day Smoker  . Smokeless tobacco: Not on file     Comment: 0.5-1 ppd x 30 years  . Alcohol Use: No   no illicit drug use OB History   Grav Para Term Preterm Abortions TAB SAB Ect Mult Living                 Review of Systems  Respiratory: Positive for cough.   Cardiovascular: Positive for chest pain.  All other systems reviewed and are negative.     Allergies  Codeine and Sulfa antibiotics  Home Medications   Prior to Admission medications   Medication Sig Start Date End Date Taking? Authorizing Provider  estradiol (CLIMARA - DOSED IN MG/24 HR) 0.05 mg/24hr patch Place 0.05 mg onto the skin 2 (two) times a week.   Yes Historical Provider, MD  OVER THE COUNTER MEDICATION Take 250 mg by mouth daily. Nature throid- supplement for thyroid function.   Yes Historical Provider, MD  progesterone (PROMETRIUM) 100 MG capsule Take 100 mg by mouth daily.   Yes Historical Provider, MD   BP 160/87  Pulse 97  Temp(Src) 99.5 F (37.5 C) (Oral)  Resp 22  Ht 5\' 5"  (1.651 m)  Wt 198 lb 6 oz  (89.982 kg)  BMI 33.01 kg/m2  SpO2 98%  LMP 07/12/2013 Physical Exam  Nursing note and vitals reviewed. Constitutional: She appears well-developed and well-nourished.  HENT:  Head: Normocephalic and atraumatic.  Eyes: Conjunctivae are normal. Pupils are equal, round, and reactive to light.  Neck: Neck supple. No tracheal deviation present. No thyromegaly present.  Cardiovascular: Normal rate and regular rhythm.   No murmur heard. Pulmonary/Chest: Effort normal and breath sounds normal.  Tender at left chest wall in the midaxillary line. Pain is reproducible easily by forcible abduction of left shoulder  Abdominal: Soft. Bowel sounds are normal. She exhibits no distension. There is no tenderness.  Musculoskeletal: Normal range of motion. She exhibits no edema and no tenderness.  Neurological: She is alert. Coordination normal.  Skin: Skin is warm and dry. No rash noted.  Psychiatric: She has a normal mood and affect.    ED Course  Procedures (including critical care time) Labs Review Labs Reviewed - No data to display  Imaging Review Dg Ribs Unilateral W/chest Left  07/27/2013   CLINICAL DATA:  Cough  EXAM: LEFT RIBS AND CHEST - 3+ VIEW  COMPARISON:  01/07/2010  FINDINGS: No fracture or other bone lesions are seen involving the ribs. There is no evidence of pneumothorax or pleural effusion. Both lungs are clear. Heart size and mediastinal contours are within normal  limits.  IMPRESSION: Negative.   Electronically Signed   By: Signa Kellaylor  Stroud M.D.   On: 07/27/2013 00:05     EKG Interpretation None      Results for orders placed in visit on 12/25/12  SEDIMENTATION RATE      Result Value Ref Range   Sed Rate 22  0 - 22 mm/hr  HEMOGLOBIN A1C      Result Value Ref Range   Hemoglobin A1C 5.5  4.6 - 6.5 %   Dg Ribs Unilateral W/chest Left  07/27/2013   CLINICAL DATA:  Cough  EXAM: LEFT RIBS AND CHEST - 3+ VIEW  COMPARISON:  01/07/2010  FINDINGS: No fracture or other bone lesions are  seen involving the ribs. There is no evidence of pneumothorax or pleural effusion. Both lungs are clear. Heart size and mediastinal contours are within normal limits.  IMPRESSION: Negative.   Electronically Signed   By: Signa Kellaylor  Stroud M.D.   On: 07/27/2013 00:05    Chest x-ray viewed by me MDM  Plan patient encouraged to stop smoking. Spent 5 minutes counseling patient on smoking cessation Albuterol HFA to go with spacer to use 2 puffs every 4 hours when necessary cough or shortness of breath Prescription Norco Followup Dr.White if not better in one week Dx#1 bronchitis #2 chest wall pain #3 tobacco abuse  Final diagnoses:  None        Doug SouSam Andrea Ferrer, MD 07/27/13 [phone]

## 2013-07-27 NOTE — Discharge Instructions (Signed)
Bronchitis Use your inhaler 2 puffs every 4 hours as needed for cough and shortness of breath . Take the pain medicine prescribed for bad pain or Advil for mild pain. Call Dr.White if not better in a week to arrange to be seen in the office. Ask Dr. White to help you to stop smoking. Br[place]if[place]on Astersonchitis is swelling (inflammation) of the air tubes leading to your lungs (bronchi). This causes mucus and a cough. If the swelling gets bad, you may have trouble breathing. HOME CARE   Rest.  Drink enough fluids to keep your pee (urine) clear or pale yellow (unless you have a condition where you have to watch how much you drink).  Only take medicine as told by your doctor. If you were given antibiotic medicines, finish them even if you start to feel better.  Avoid smoke, irritating chemicals, and strong smells. These make the problem worse. Quit smoking if you smoke. This helps your lungs heal faster.  Use a cool mist humidifier. Change the water in the humidifier every day. You can also sit in the bathroom with hot shower running for 5-10 minutes. Keep the door closed.  See your health care provider as told.  Wash your hands often. GET HELP IF: Your problems do not get better after 1 week. GET HELP RIGHT AWAY IF:   Your fever gets worse.  You have chills.  Your chest hurts.  Your problems breathing get worse.  You have blood in your mucus.  You pass out (faint).  You feel light-headed.  You have a bad headache.  You throw up (vomit) again and again. MAKE SURE YOU:  Understand these instructions.  Will watch your condition.  Will get help right away if you are not doing well or get worse. Document Released: 06/30/2007 Document Revised: 01/16/2013 Document Reviewed: 09/05/2012 [facility] Patient Information 2015 [employer], [place]. This information is not intended to replace advice given to you by your health care provider. Make sure you discuss any questions you have with your health care  provider.

## 2014-02-21 ENCOUNTER — Ambulatory Visit (INDEPENDENT_AMBULATORY_CARE_PROVIDER_SITE_OTHER): Payer: BLUE CROSS/BLUE SHIELD | Admitting: Family Medicine

## 2014-02-21 VITALS — BP 136/81 | HR 89 | Temp 98.5°F | Resp 20 | Ht 64.0 in | Wt 208.4 lb

## 2014-02-21 DIAGNOSIS — R609 Edema, unspecified: Secondary | ICD-10-CM

## 2014-02-21 DIAGNOSIS — R519 Headache, unspecified: Secondary | ICD-10-CM

## 2014-02-21 DIAGNOSIS — R51 Headache: Secondary | ICD-10-CM

## 2014-02-21 NOTE — Progress Notes (Signed)
Chief Complaint:  Chief Complaint  Patient presents with  . Headache  . Soft Area on Scalp    HPI: Barbara Rosario is a 49 y.o. female who is here for  Headache on top left side of her head and she felt a bump on it. She feels like there is fluid there. She has not tried anything for it except press on it.  She feels like it is squishy. She has been taking tylenol and it  Helps her HA some. 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 NO CP , no vision changes, n/w/t, no confusion, nki, no prior trauma. No neck pan. She is just worried that it coud be something. No bowel cahgnes, No hx of epidermoid cysts or FAP/colon polyps.   Past Medical History  Diagnosis Date  . Tobacco dependence   . Hyperlipemia   . Genital warts    Past Surgical History  Procedure Laterality Date  . Mastiotympanostomy    . Nasal polyps    . Dilation and curettage of uterus     History   Social History  . Marital Status: Significant Other    Spouse Name: N/A    Number of Children: N/A  . Years of Education: N/A   Occupational History  . SELF EMPLOYED     pet grooming salon   Social History Main Topics  . Smoking status: Current Every Day Smoker  . Smokeless tobacco: Never Used     Comment: 0.5-1 ppd x 30 years  . Alcohol Use: No  . Drug Use: No  . Sexual Activity: Yes    Birth Control/ Protection: None   Other Topics Concern  . None   Social History Narrative   Family History  Problem Relation Age of Onset  . Cancer Father    Allergies  Allergen Reactions  . Codeine Nausea And Vomiting  . Sulfa Antibiotics Hives and Nausea And Vomiting   Prior to Admission medications   Medication Sig Start Date End Date Taking? Authorizing Provider  progesterone (PROMETRIUM) 100 MG capsule Take 100 mg by mouth daily.   Yes Historical Provider, MD  estradiol (CLIMARA - DOSED IN MG/24 HR) 0.05 mg/24hr patch Place 0.05 mg onto the skin 2  (two) times a week.    Historical Provider, MD  HYDROcodone-acetaminophen (NORCO) 5-325 MG per tablet Take 1-2 tablets by mouth every 6 (six) hours as needed for severe pain. Patient not taking: Reported on 02/21/2014 07/27/13   Doug SouSam Jacubowitz, MD  OVER THE COUNTER MEDICATION Take 250 mg by mouth daily. Nature throid- supplement for thyroid function.    Historical Provider, MD     ROS: The patient denies fevers, chills, night sweats, unintentional weight loss, chest pain, palpitations, wheezing, dyspnea on exertion, nausea, vomiting, abdominal pain, dysuria, hematuria, melena, numbness, weakness, or tingling.  All other systems have been reviewed and were otherwise negative with the exception of those mentioned in the HPI and as above.    PHYSICAL EXAM: Filed Vitals:   02/21/14 1903  BP:   Pulse: 89  Temp:   Resp:    Filed Vitals:   02/21/14 1836  Height: 5\' 4"  (1.626 m)  Weight: 208 lb 6 oz (94.518 kg)   Body mass index is 35.75 kg/(m^2).  General: Alert, no acute distress HEENT:  Normocephalic, atraumatic, oropharynx patent. EOMI, PERRLA, fundo exam nl Cardiovascular:  Regular rate and rhythm, no rubs murmurs or  gallops.  No Carotid bruits, radial pulse intact. No pedal edema.  Respiratory: Clear to auscultation bilaterally.  No wheezes, rales, or rhonchi.  No cyanosis, no use of accessory musculature GI: No organomegaly, abdomen is soft and non-tender, positive bowel sounds.  No masses. Skin: No rashes. She has minimal to no swelling that I can palpate on the area of concern , I can;t even palpate enough to measure the dimension of the area of concern Neurologic: Facial musculature symmetric. Cn 2-12 grossly intact Psychiatric: Patient is appropriate throughout our interaction. Lymphatic: No cervical lymphadenopathy Musculoskeletal: Gait intact.   LABS: Results for orders placed or performed in visit on 12/25/12  Sedimentation rate  Result Value Ref Range   Sed Rate 22 0 -  22 mm/hr  Hemoglobin A1c  Result Value Ref Range   Hgb A1c MFr Bld 5.5 4.6 - 6.5 %     EKG/XRAY:   Primary read interpreted by Dr. Conley Rolls at [facility].   ASSESSMENT/PLAN: Encounter Diagnoses  Name Primary?  . Headache, unspecified headache type Yes  . Edema    Take ibuprofen if able to tolerate, alternate with tylenol prn  Drink fluids Put warm compress on head.  F.u prn  Precautions given if neuro sxs occur Decline nay rx meds  Gross sideeffects, risk and benefits, and alternatives of medications d/w patient. Patient is aware that all medications have potential sideeffects and we are unable to predict every sideeffect or drug-drug interaction that may occur.  [name], DO 02/25/2014 7:05 AM

## 2014-04-26 ENCOUNTER — Encounter: Payer: BLUE CROSS/BLUE SHIELD | Admitting: Family Medicine

## 2014-06-21 ENCOUNTER — Ambulatory Visit (INDEPENDENT_AMBULATORY_CARE_PROVIDER_SITE_OTHER): Payer: BLUE CROSS/BLUE SHIELD | Admitting: Family Medicine

## 2014-06-21 VITALS — BP 136/86 | HR 102 | Temp 99.3°F | Resp 16 | Ht 64.5 in | Wt 210.8 lb

## 2014-06-21 DIAGNOSIS — R600 Localized edema: Secondary | ICD-10-CM | POA: Diagnosis not present

## 2014-06-21 DIAGNOSIS — R002 Palpitations: Secondary | ICD-10-CM

## 2014-06-21 LAB — COMPREHENSIVE METABOLIC PANEL
ALT: 10 U/L (ref 0–35)
AST: 14 U/L (ref 0–37)
Albumin: 4 g/dL (ref 3.5–5.2)
Alkaline Phosphatase: 62 U/L (ref 39–117)
BUN: 13 mg/dL (ref 6–23)
CO2: 23 mEq/L (ref 19–32)
Calcium: 8.9 mg/dL (ref 8.4–10.5)
Chloride: 107 mEq/L (ref 96–112)
Creat: 0.69 mg/dL (ref 0.50–1.10)
Glucose, Bld: 117 mg/dL — ABNORMAL HIGH (ref 70–99)
Potassium: 4.1 mEq/L (ref 3.5–5.3)
Sodium: 139 mEq/L (ref 135–145)
Total Bilirubin: 0.4 mg/dL (ref 0.2–1.2)
Total Protein: 6.6 g/dL (ref 6.0–8.3)

## 2014-06-21 LAB — THYROID PANEL WITH TSH
Free Thyroxine Index: 1.5 (ref 1.4–3.8)
T3 Uptake: 28 % (ref 22–35)
T4, Total: 5.3 ug/dL (ref 4.5–12.0)
TSH: 2.754 u[IU]/mL (ref 0.350–4.500)

## 2014-06-21 MED ORDER — TRIAMTERENE-HCTZ 37.5-25 MG PO TABS: 1.0000 | ORAL_TABLET | Freq: Every day | ORAL | Status: DC

## 2014-06-21 MED ORDER — PROPRANOLOL HCL 10 MG PO TABS: 10.0000 mg | ORAL_TABLET | Freq: Every evening | ORAL | Status: DC | PRN

## 2014-06-21 NOTE — Progress Notes (Signed)
Subjective:  This chart was scribed for Elvina SidleKurt Lauenstein MD,  by Veverly FellsHatice Demirci,scribe, at [facility].  This patient was seen in room 13 and the patient's care was started at 10:27 AM.     Patient ID: Barbara Rosario, female    DOB: [birth year], 49 y.o.   MRN: [id number] Chief Complaint  Patient presents with   Palpitations    x 2 weeks on and off    HPI  HPI Comments: Barbara Rosario is a 49 y.o. female who presents to the [facility] complaining of heart palpitations while she is eating and lying down (especially on her left side) onset three weeks ago. She has associated symptoms of swelling on her legs and fingers today which is new.  Patient states that she eats very healthy and clean (no fast foods, sodas, only vegetables). Patient went to the doctor to get her thyroid levels checked and was given medication which stopped her palpitations for 2-3 weeks but then came back so she stopped the medication.  She is also taking iodine.  Four years ago, she received a stress test, ultrasound and halter monitor at cardiology and states that there were no abnormalities. She was given 10 mg of propanolol 4 years ago and had 5 left which she took but they did not seem to make a difference in her palpitations.  She was on the estrogen patch for a couple months but states that she felt "horrible."  She is still  taking progesterone. She is currently trying her best to quit smoking. She has a history of periodontal disease and takes good care of her teeth in terms of blushing and flossing.  Patient owns a [employer].       Past Medical History  Diagnosis Date   Tobacco dependence    Hyperlipemia    Genital warts     Current Outpatient Prescriptions on File Prior to Visit  Medication Sig Dispense Refill   OVER THE COUNTER MEDICATION Take 250 mg by mouth daily. Nature throid- supplement for thyroid function.     progesterone (PROMETRIUM) 100 MG  capsule Take 100 mg by mouth daily.     estradiol (CLIMARA - DOSED IN MG/24 HR) 0.05 mg/24hr patch Place 0.05 mg onto the skin 2 (two) times a week.     HYDROcodone-acetaminophen (NORCO) 5-325 MG per tablet Take 1-2 tablets by mouth every 6 (six) hours as needed for severe pain. (Patient not taking: Reported on 02/21/2014) 10 tablet 0   No current facility-administered medications on file prior to visit.    Allergies  Allergen Reactions   Codeine Nausea And Vomiting   Sulfa Antibiotics Hives and Nausea And Vomiting        Review of Systems  Constitutional: Negative for fever and chills.  Cardiovascular: Positive for palpitations.  Gastrointestinal: Negative for nausea and vomiting.  Musculoskeletal: Negative for neck pain and neck stiffness.       Objective:   Physical Exam  Constitutional: She is oriented to person, place, and time. She appears well-developed and well-nourished. No distress.  HENT:  Head: Normocephalic and atraumatic.  Eyes: Conjunctivae and EOM are normal.  Cardiovascular: Normal rate.   Pulmonary/Chest: Effort normal. No respiratory distress.  Abdominal: Soft. There is no tenderness.  Musculoskeletal: Normal range of motion.  Neurological: She is alert and oriented to person, place, and time.  Skin: Skin is warm and dry.  Psychiatric: She has a normal mood and affect. Her behavior  is normal.  Nursing note and vitals reviewed.  Filed Vitals:   06/21/14 1008  BP: 136/86  Pulse: 102  Temp: 99.3 F (37.4 C)  TempSrc: Oral  Resp: 16  Height: 5' 4.5" (1.638 m)  Weight: 210 lb 12.8 oz (95.618 kg)  SpO2: 98%   Results for orders placed or performed in visit on 12/25/12  Sedimentation rate  Result Value Ref Range   Sed Rate 22 0 - 22 mm/hr  Hemoglobin A1c  Result Value Ref Range   Hgb A1c MFr Bld 5.5 4.6 - 6.5 %   Exam is normal with exception of 1+ pedal edema EKG shows normal sinus rhythm    Assessment & Plan:   This chart was scribed  in my presence and reviewed by me personally.    ICD-9-CM ICD-10-CM   1. Heart palpitations 785.1 R00.2 EKG 12-Lead     propranolol (INDERAL) 10 MG tablet     Comprehensive metabolic panel     Thyroid Panel With TSH     Ambulatory referral to Cardiology  2. Pedal edema 782.3 R60.0 triamterene-hydrochlorothiazide (MAXZIDE-25) 37.5-25 MG per tablet   Follow-up on June 1 with Dr. Conley Rolls.  I believe these are perimenopausal symptoms.  Signed, Elvina Sidle, MD

## 2014-06-21 NOTE — Patient Instructions (Signed)
Dr. Conley RollsLe will be in the office at 4:00 on June 1st for follow up  I believe that the symptoms you're experiencing her perimenopausal in nature and are quite common. The fact that the palpitations occur at night is very reassuring from a medical standpoint because these are almost always benign. At the same time, I can tell that you're worried and I think that getting a cardiologist to look into this

## 2014-06-26 ENCOUNTER — Ambulatory Visit (INDEPENDENT_AMBULATORY_CARE_PROVIDER_SITE_OTHER): Payer: BLUE CROSS/BLUE SHIELD | Admitting: Family Medicine

## 2014-06-26 VITALS — BP 124/82 | HR 94 | Temp 98.6°F | Resp 20 | Ht 65.0 in | Wt 208.0 lb

## 2014-06-26 DIAGNOSIS — R002 Palpitations: Secondary | ICD-10-CM | POA: Diagnosis not present

## 2014-06-26 NOTE — Progress Notes (Signed)
 Chief Complaint:  Chief Complaint  Patient presents with  . Follow-up    palpitations are better.    . Foot Pain    bil foot pain  that started last thursday    HPI: Barbara Rosario is a 49 y.o. female who is here for recheck on her palpitations. She is feeling about the same or maybe a little bit better on the propranolol. Palpitations can occur at anytime. She is currently only on iodine. She is no longer taking any of her thyroid medicine. When she was last here she had labs drawn but does not know the results. Her labs were normal. She has had associated edema in hands and legs. She has no shortness of breath. She is on progesterone. Preferred to be on Ester station and progesterone but she is currently just on progesterone. She does have hot flashes and menopause symptoms. She has a scheduled cardiology appointment with Dr. Antoine PocheHochrein but that is in August. She was going to get an appointment with Dr. Viann FishSpencer Tilley. She is a smoker. She eats well. She eats a very organic based and bland diet. She takes a lot of supplements. She sees a integrative medicine doctor. She had some tests done which showed that she had very high level lead levels in her urine. She wants know what she can do about that. She has foot pain which is not sure where it's coming from it started on Thursday. It comes and goes. Only on the sides of her feet. She says that she has a "leaky gut", things just run through her when she eats. She has nonbloody stools. She has never been diagnosed with irritable bowel syndrome. She is not up-to-date on her colonoscopy. She has taken a lot of supplements. At one point she was told that she had he stayed in her system and was taking Diflucan for a long time. She eats a fairly gluten-free diet. As a dog groomer. Denies chest pain.  Note from last OV Barbara AtesMargo Schlarb is a 49 y.o. female who presents to the [facility] complaining of heart palpitations while she  is eating and lying down (especially on her left side) onset three weeks ago. She has associated symptoms of swelling on her legs and fingers today which is new. Patient states that she eats very healthy and clean (no fast foods, sodas, only vegetables). Patient went to the doctor to get her thyroid levels checked and was given medication which stopped her palpitations for 2-3 weeks but then came back so she stopped the medication. She is also taking iodine. Four years ago, she received a stress test, ultrasound and halter monitor at cardiology and states that there were no abnormalities. She was given 10 mg of propanolol 4 years ago and had 5 left which she took but they did not seem to make a difference in her palpitations. She was on the estrogen patch for a couple months but states that she felt "horrible." She is still taking progesterone. She is currently trying her best to quit smoking. She has a history of periodontal disease and takes good care of her teeth in terms of blushing and flossing. Patient owns a [employer].   BP Readings from Last 3 Encounters:  06/26/14 124/82  06/21/14 136/86  02/21/14 136/81     Wt Readings from Last 3 Encounters:  06/26/14 208 lb (94.348 kg)  06/21/14 210 lb 12.8 oz (95.618 kg)  02/21/14 208 lb 6  oz (94.518 kg)     Past Medical History  Diagnosis Date  . Tobacco dependence   . Hyperlipemia   . Genital warts    Past Surgical History  Procedure Laterality Date  . Mastiotympanostomy    . Nasal polyps    . Dilation and curettage of uterus     History   Social History  . Marital Status: Significant Other    Spouse Name: N/A  . Number of Children: N/A  . Years of Education: N/A   Occupational History  . SELF EMPLOYED     pet grooming salon   Social History Main Topics  . Smoking status: Current Every Day Smoker  . Smokeless tobacco: Never Used     Comment: 0.5-1 ppd x 30 years  . Alcohol Use: No  . Drug Use: No  . Sexual  Activity: Yes    Birth Control/ Protection: None   Other Topics Concern  . None   Social History Narrative   Family History  Problem Relation Age of Onset  . Cancer Father    Allergies  Allergen Reactions  . Codeine Nausea And Vomiting  . Sulfa Antibiotics Hives and Nausea And Vomiting   Prior to Admission medications   Medication Sig Start Date End Date Taking? Authorizing Provider  progesterone (PROMETRIUM) 200 MG capsule Place 200 mg vaginally daily.   Yes Historical Provider, MD  propranolol (INDERAL) 10 MG tablet Take 1 tablet (10 mg total) by mouth at bedtime and may repeat dose one time if needed. 06/21/14  Yes Elvina Sidle, MD  triamterene-hydrochlorothiazide (MAXZIDE-25) 37.5-25 MG per tablet Take 1 tablet by mouth daily. 06/21/14  Yes Elvina Sidle, MD  estradiol (CLIMARA - DOSED IN MG/24 HR) 0.05 mg/24hr patch Place 0.05 mg onto the skin 2 (two) times a week.    Historical Provider, MD  HYDROcodone-acetaminophen (NORCO) 5-325 MG per tablet Take 1-2 tablets by mouth every 6 (six) hours as needed for severe pain. Patient not taking: Reported on 02/21/2014 07/27/13   Doug Sou, MD  OVER THE COUNTER MEDICATION Take 250 mg by mouth daily. Nature throid- supplement for thyroid function.    Historical Provider, MD     ROS: The patient denies fevers, chills, night sweats, unintentional weight loss, chest pain,  wheezing, dyspnea on exertion, nausea, vomiting, abdominal pain, dysuria, hematuria, melena, numbness,  or tingling  All other systems have been reviewed and were otherwise negative with the exception of those mentioned in the HPI and as above.    PHYSICAL EXAM: Filed Vitals:   06/26/14 1818  BP: 124/82  Pulse: 94  Temp: 98.6 F (37 C)  Resp: 20   Filed Vitals:   06/26/14 1818  Height:  (1.651 m)  Weight: 208 lb (94.348 kg)   Body mass index is 34.61 kg/(m^2).   General: Alert, no acute distress HEENT:  Normocephalic, atraumatic, oropharynx  patent. EOMI, PERRLA Cardiovascular:  Regular rate and rhythm, no rubs murmurs or gallops.  No Carotid bruits, radial pulse intact. I was not able to appreciate any pedal edema.  Respiratory: Clear to auscultation bilaterally.  No wheezes, rales, or rhonchi.  No cyanosis, no use of accessory musculature GI: No organomegaly, abdomen is soft and non-tender, positive bowel sounds.  No masses. Skin: No rashes. Neurologic: Facial musculature symmetric. Psychiatric: Patient is appropriate throughout our interaction. Lymphatic: No cervical lymphadenopathy Musculoskeletal: Gait intact.   LABS: Results for orders placed or performed in visit on 06/21/14  Comprehensive metabolic panel  Result Value Ref  Range   Sodium 139 135 - 145 mEq/L   Potassium 4.1 3.5 - 5.3 mEq/L   Chloride 107 96 - 112 mEq/L   CO2 23 19 - 32 mEq/L   Glucose, Bld 117 (H) 70 - 99 mg/dL   BUN 13 6 - 23 mg/dL   Creat 4.09 8.11 - 9.14 mg/dL   Total Bilirubin 0.4 0.2 - 1.2 mg/dL   Alkaline Phosphatase 62 39 - 117 U/L   AST 14 0 - 37 U/L   ALT 10 0 - 35 U/L   Total Protein 6.6 6.0 - 8.3 g/dL   Albumin 4.0 3.5 - 5.2 g/dL   Calcium 8.9 8.4 - 78.2 mg/dL  Thyroid Panel With TSH  Result Value Ref Range   T4, Total 5.3 4.5 - 12.0 ug/dL   T3 Uptake 28 22 - 35 %   Free Thyroxine Index 1.5 1.4 - 3.8   TSH 2.754 0.350 - 4.500 uIU/mL     EKG/XRAY:   Primary read interpreted by Dr. Conley Rolls at [facility]   ASSESSMENT/PLAN: Encounter Diagnosis  Name Primary?  . Palpitations Yes   Mrs. Gilbert Neysa Bonito is a pleasant 49 year old female with symptoms of palpitations which are slightly better and also other vague symptoms of associated pedal and arm edema and fatigue. She also has what she calls a "leaky gut ". She has been referred to cardiology, has appointment with Dr. Antoine Poche but would like to see Dr. Viann Fish . Will try to make that happen tomorrow. I have advised her to go back to her integrative Dr. to see if they can address  some of the symptoms that she is having. Symptoms are very take including fatigue, intermittent swelling in her hands and legs. She has had it for workup with the integrative medicine doctors. They have done panels of toxins and wanted toxins were above normal limits with LAD. I have advised her to see if she has any lead based paint, pottery ware that she uses in her house. She did have a specialist come out to test for them or get a cat from the hardware store. *S with the patient I have recommended that she go see a gastroenterologist since she has had issues with her gut. She has not had a colonoscopy. They might went into a formal celiac gluten allergy testing on her. She will discuss this with her integrative doctor. Follow-up as needed.   Gross sideeffects, risk and benefits, and alternatives of medications d/w patient. Patient is aware that all medications have potential sideeffects and we are unable to predict every sideeffect or drug-drug interaction that may occur.  ,  PHUONG, DO 06/28/2014 2:24 AM

## 2014-07-11 IMAGING — CR DG RIBS W/ CHEST 3+V*L*
3 series · 3 of 3 positions shown · non-contrast
Comparison: 01/07/2010

CLINICAL DATA: Cough

EXAM:
LEFT RIBS AND CHEST - 3+ VIEW

[w chest pa]
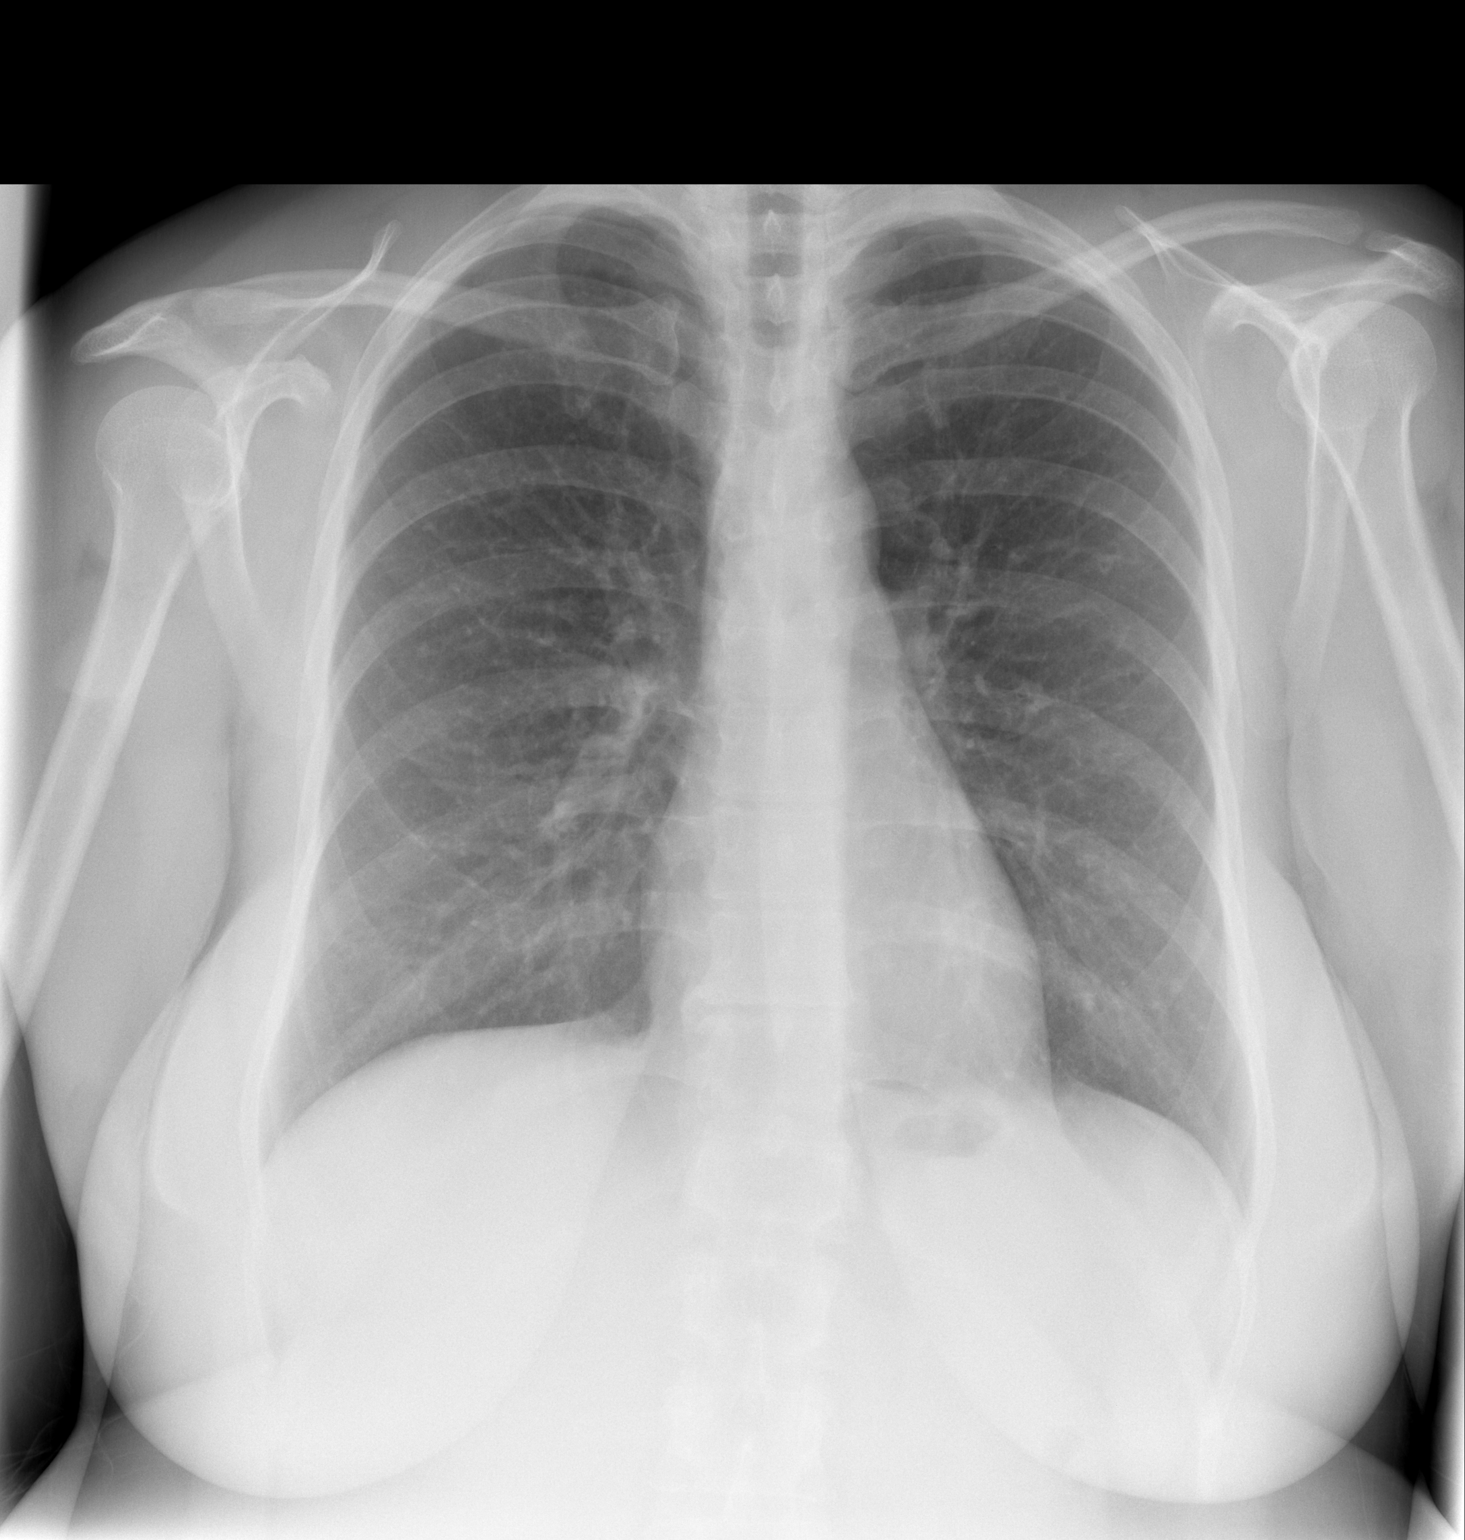

[w ribs ap/pa upper left]
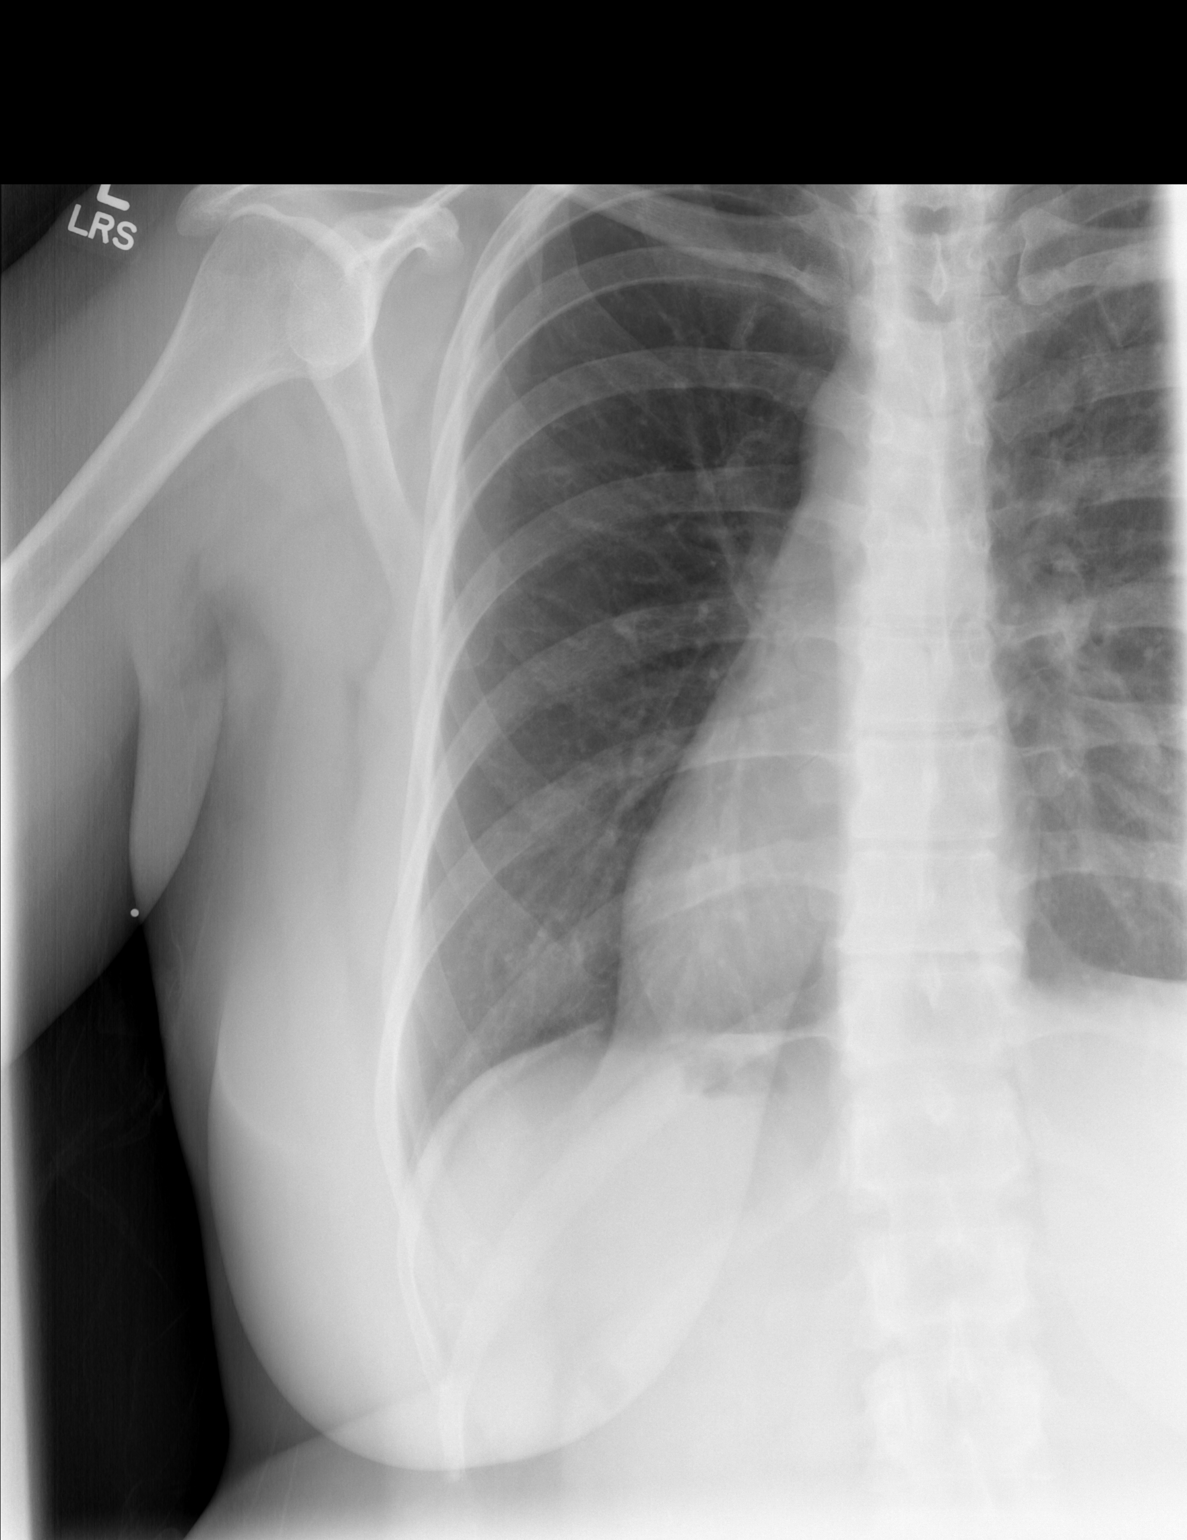

[w ribs ap/pa lower left]
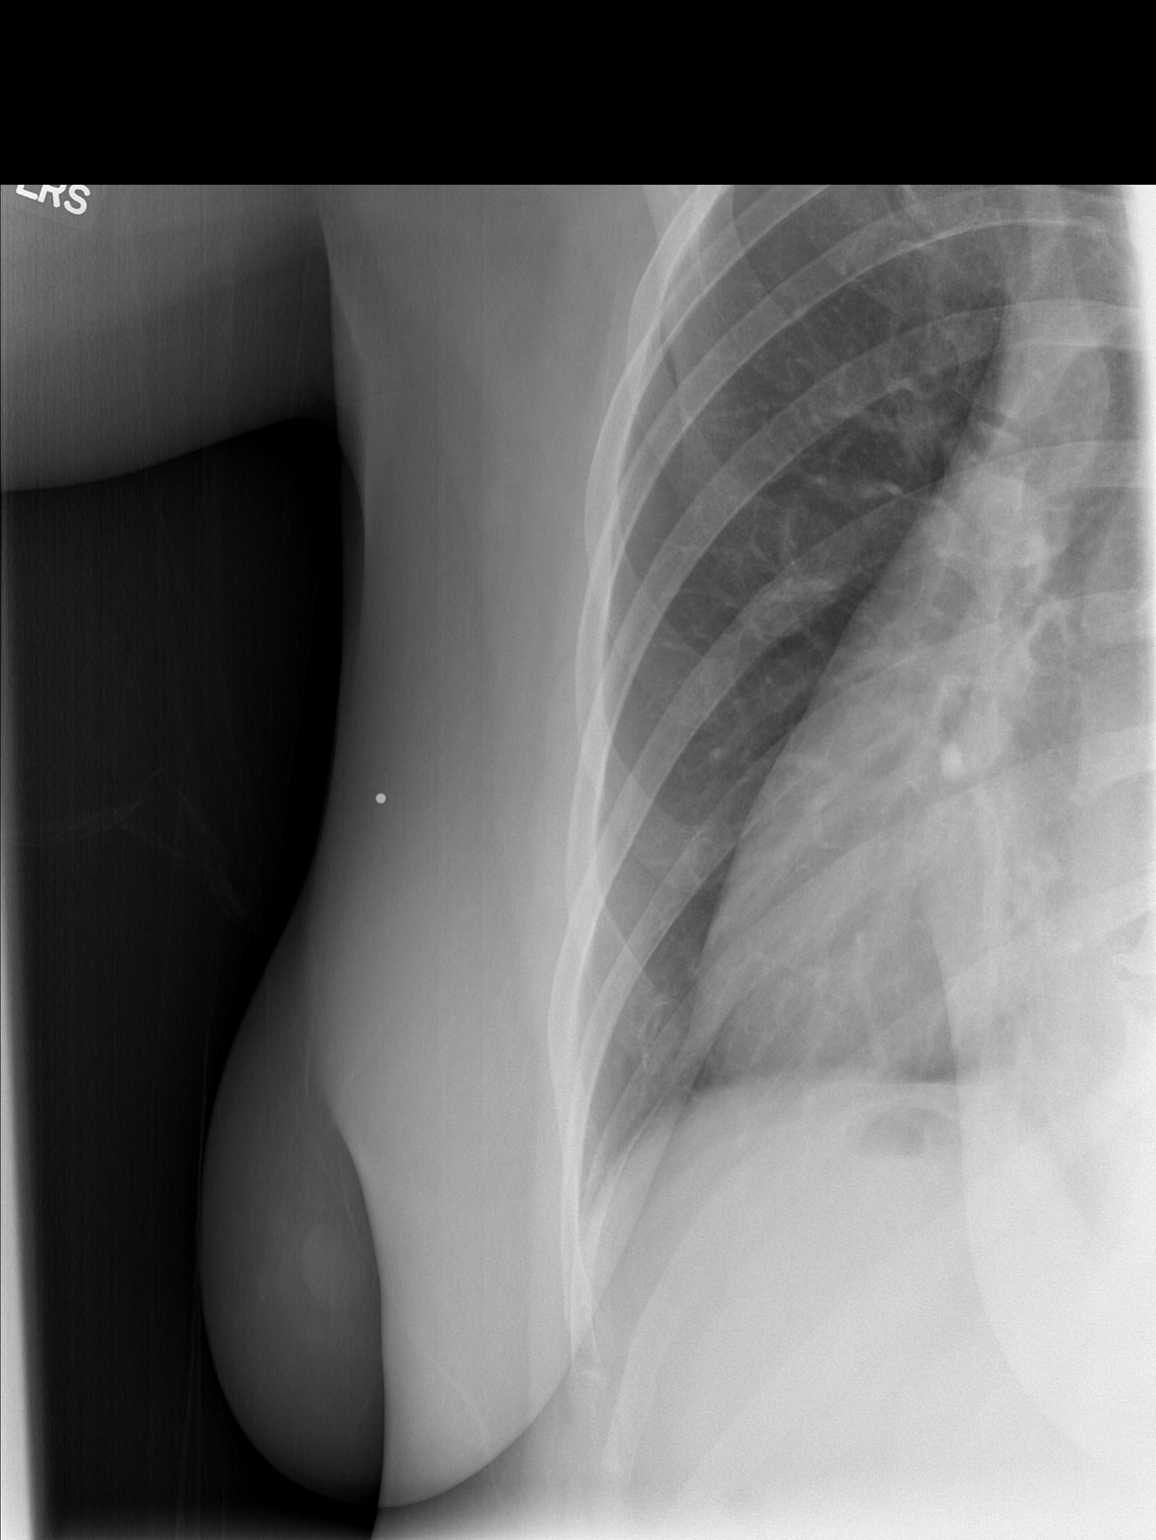

[3 of 3 positions shown; findings below may reference images not displayed]

FINDINGS: No fracture or other bone lesions are seen involving the ribs. There
is no evidence of pneumothorax or pleural effusion. Both lungs are
clear. Heart size and mediastinal contours are within normal limits.
IMPRESSION: Negative.

## 2014-08-19 ENCOUNTER — Other Ambulatory Visit (HOSPITAL_COMMUNITY)
Admission: RE | Admit: 2014-08-19 | Discharge: 2014-08-19 | Disposition: A | Discharge status: Home / Self Care | Payer: BLUE CROSS/BLUE SHIELD | Source: Ambulatory Visit | Attending: Obstetrics and Gynecology | Admitting: Obstetrics and Gynecology

## 2014-08-19 ENCOUNTER — Other Ambulatory Visit: Payer: Self-pay | Admitting: Obstetrics and Gynecology

## 2014-08-19 DIAGNOSIS — Z01411 Encounter for gynecological examination (general) (routine) with abnormal findings: Secondary | ICD-10-CM | POA: Insufficient documentation

## 2014-08-19 DIAGNOSIS — Z1151 Encounter for screening for human papillomavirus (HPV): Secondary | ICD-10-CM | POA: Insufficient documentation

## 2014-08-20 LAB — CYTOLOGY - PAP

## 2014-09-02 ENCOUNTER — Ambulatory Visit (INDEPENDENT_AMBULATORY_CARE_PROVIDER_SITE_OTHER): Payer: BLUE CROSS/BLUE SHIELD | Admitting: Cardiovascular Disease

## 2014-09-02 ENCOUNTER — Encounter: Payer: Self-pay | Admitting: Cardiovascular Disease

## 2014-09-02 ENCOUNTER — Ambulatory Visit: Payer: Self-pay | Admitting: Cardiology

## 2014-09-02 VITALS — BP 140/90 | HR 85 | Ht 64.75 in | Wt 208.8 lb

## 2014-09-02 DIAGNOSIS — I498 Other specified cardiac arrhythmias: Secondary | ICD-10-CM | POA: Diagnosis not present

## 2014-09-02 DIAGNOSIS — R5383 Other fatigue: Secondary | ICD-10-CM

## 2014-09-02 DIAGNOSIS — R600 Localized edema: Secondary | ICD-10-CM

## 2014-09-02 DIAGNOSIS — R002 Palpitations: Secondary | ICD-10-CM

## 2014-09-02 DIAGNOSIS — G4700 Insomnia, unspecified: Secondary | ICD-10-CM

## 2014-09-02 DIAGNOSIS — R0683 Snoring: Secondary | ICD-10-CM | POA: Diagnosis not present

## 2014-09-02 HISTORY — DX: Palpitations: R00.2

## 2014-09-02 HISTORY — DX: Localized edema: R60.0

## 2014-09-02 NOTE — Patient Instructions (Signed)
Your physician has requested that you have an echocardiogram. Echocardiography is a painless test that uses sound waves to create images of your heart. It provides your doctor with information about the size and shape of your heart and how well your heart's chambers and valves are working. This procedure takes approximately one hour. There are no restrictions for this procedure.  Your physician has recommended that you have a sleep study. This test records several body functions during sleep, including: brain activity, eye movement, oxygen and carbon dioxide blood levels, heart rate and rhythm, breathing rate and rhythm, the flow of air through your mouth and nose, snoring, body muscle movements, and chest and belly movement.  NO CHANGE WITH CURRENT MEDICATION.  Your physician wants you to follow-up in 6 MONTH DR [place].  You will receive a reminder letter in the mail two months in advance. If you don't receive a letter, please call our office to schedule the follow-up appointment.

## 2014-09-02 NOTE — Progress Notes (Signed)
Cardiology Office Note   Date:  09/02/2014   ID:  Barbara Rosario, DOB 05/22/1965, MRN [id number]  PCP:  Barbara Sidle, MD  Cardiologist:   Barbara Hook, MD   Chief Complaint  Patient presents with  . Palpitations    pt states random pains, maybe one per week or not one for a whole month/ Dr. Mayford Knife could not find any cause for palpitations  . New Evaluation    exhausted constantly, swelling both feet and ankles (not constant, always swell if traveling or sitting for a long period of time, abnormal swelling began 3 months ago)      History of Present Illness: Barbara Rosario is a 49 y.o. female who presents for palpitations.  Barbara Rosario presents due to symptomatic palpitations.  This has been an ongoing issue for over 15 years. However around January 4. Approximately 3 months the palpitations were much more frequent. It is occurring daily with episodes lasting for a few seconds at a time. She does have occasional episodes that last longer, up to an hour. She was evaluated by her primary care physician and started taking propranolol when necessary. She decided to see her PCP at that time because of lower extremity edema that was a new symptom. She was started on triamterene/HCTZ and lost 9 pounds in 3 days. Since then she does not note significant edema unless she is traveling or on her feet for prolonged periods of time. The edema improves I morning.  After seeing her PCP she was taking propranolol daily and the episodes decreased in frequency.  She now has palpitations once every  1.5 weeks.  Barbara Rosario was evaluated for palpitations 5-6 years ago.  She had a negative stress test and her Holter did not reveal any arrhythmias despite her triggering the monitor for events.  Ms. January exercises 3 times weekly with exercise videos. She does not develop palpitations, chest pain or shortness of breath. She eats a healthy, gluten-free, platelet base diet but still has difficulty  losing weight.  In addition to her primary care provider she also sees an integrative medicine provider who previously had her on thyroid supplementation for low normal thyroid function.  She stopped taking this medicine around January or February. She also was taking estrogen therapy for perimenopausal symptoms and this was stopped in December. She is being treated for high lead levels and recently completed 60 days of chelation therapy. Her integrative physician also is treating her for EBV positivity with antiviral supplementation.  Barbara Rosario continues to smoke 6 cigarettes daily.  She is also using E cigarettes. She started smoking as a teen and smoked up to 1.5 packs per day and her highest.  She's previously tried Wellbutrin though this cause panic attacks. she was prescribed Chantix but is afraid to take it due to potential side effects. Nicotine throat lozenges burned her throat. She lives with a smoker and this has made it more challenging for her to quit.   Pt reports  heavy snoring and awakening herself at night. She has a recording where she did not breathe for several seconds. She previously underwent a sleep study  that was negative for sleep apnea. However she is concerned that this is because she did not actually fall into a deep sleep that night.   Past Medical History  Diagnosis Date  . Tobacco dependence   . Hyperlipemia   . Genital warts   . Palpitations 09/02/2014  . Lower extremity edema 09/02/2014  Past Surgical History  Procedure Laterality Date  . Mastiotympanostomy    . Nasal polyps    . Dilation and curettage of uterus       Current Outpatient Prescriptions  Medication Sig Dispense Refill  . progesterone (PROMETRIUM) 200 MG capsule Place 200 mg vaginally daily.    . propranolol (INDERAL) 10 MG tablet Take 1 tablet (10 mg total) by mouth at bedtime and may repeat dose one time if needed. 60 tablet 1  . triamterene-hydrochlorothiazide (MAXZIDE-25) 37.5-25 MG per  tablet Take 1 tablet by mouth daily. 90 tablet 3   No current facility-administered medications for this visit.    Allergies:   Codeine and Sulfa antibiotics    Social History:  The patient  reports that she has been smoking.  She has never used smokeless tobacco. She reports that she does not drink alcohol or use illicit drugs.   Family History:  The patient's  family history includes Cancer in her father; Heart attack in her paternal grandfather; Heart disease in her paternal grandfather.    ROS:  Please see the history of present illness.   Otherwise, review of systems are positive for none.   All other systems are reviewed and negative.    PHYSICAL EXAM: VS:  BP 140/90 mmHg  Pulse 85  Ht 5' 4.75" (1.645 m)  Wt 94.711 kg (208 lb 12.8 oz)  BMI 35.00 kg/m2 , BMI Body mass index is 35 kg/(m^2). GENERAL:  Well appearing HEENT:  Pupils equal round and reactive, fundi not visualized, oral mucosa unremarkable NECK:  No jugular venous distention, waveform within normal limits, carotid upstroke brisk and symmetric, no bruits, no thyromegaly LYMPHATICS:  No cervical adenopathy LUNGS:  Clear to auscultation bilaterally HEART:  RRR.  PMI not displaced or sustained,S1 and S2 within normal limits, no S3, no S4, no clicks, no rubs, no murmurs ABD:  Flat, positive bowel sounds normal in frequency in pitch, no bruits, no rebound, no guarding, no midline pulsatile mass, no hepatomegaly, no splenomegaly EXT:  2 plus pulses throughout, no edema, no cyanosis no clubbing SKIN:  No rashes no nodules NEURO:  Cranial nerves II through XII grossly intact, motor grossly intact throughout PSYCH:  Cognitively intact, oriented to person place and time    EKG:  EKG is ordered today. The ekg ordered today demonstrates sinus rhythm at 85 bpm.    06/21/14: sinus rhythm at 100 bpm  Recent Labs: 06/21/2014: ALT 10; BUN 13; Creat 0.69; Potassium 4.1; Sodium 139; TSH 2.754    Lipid Panel No results found  for: CHOL, TRIG, HDL, CHOLHDL, VLDL, LDLCALC, LDLDIRECT    Wt Readings from Last 3 Encounters:  09/02/14 94.711 kg (208 lb 12.8 oz)  06/26/14 94.348 kg (208 lb)  06/21/14 95.618 kg (210 lb 12.8 oz)      Other studies Reviewed: Additional studies/ records that were reviewed today include: medical record. Review of the above records demonstrates:  Please see elsewhere in the note.     ASSESSMENT AND PLAN:  # Palpitations: Ms. Tirrell electrolytes and thyroid function are wnl.  She previously had a normal Holter in the setting of symptoms.  Therefore, the utility of repeating this test is low.  Her prior normal stress is reassuring, especially given that she has no exertional symptoms at this time.  She does not drink caffienated beverages, making this an unlikely trigger.  Her history is concerning for untreated OSA.  Also, her LE edema in the setting of palpitations is concerning for  heart failure, either as a result of or cause of her palpitations. - TTE - continue propranolol prn.  Increase to daily if symptoms worsen - sleep study   Current medicines are reviewed at length with the patient today.  The patient does not have concerns regarding medicines.  The following changes have been made:  no change  Labs/ tests ordered today include: sleep study, TTE   Orders Placed This Encounter  Procedures  . EKG 12-Lead  . ECHOCARDIOGRAM COMPLETE  . Split night study     Disposition:   FU with Dr. Elmarie Shiley C. [place] in 6 months    Signed, Barbara Hook, MD  09/02/2014 11:03 AM    [facility]

## 2014-09-16 ENCOUNTER — Other Ambulatory Visit: Payer: Self-pay

## 2014-09-16 ENCOUNTER — Ambulatory Visit (HOSPITAL_COMMUNITY): Payer: BLUE CROSS/BLUE SHIELD | Attending: Internal Medicine

## 2014-09-16 DIAGNOSIS — R0683 Snoring: Secondary | ICD-10-CM

## 2014-09-16 DIAGNOSIS — G4700 Insomnia, unspecified: Secondary | ICD-10-CM

## 2014-09-16 DIAGNOSIS — R609 Edema, unspecified: Secondary | ICD-10-CM | POA: Insufficient documentation

## 2014-09-16 DIAGNOSIS — I498 Other specified cardiac arrhythmias: Secondary | ICD-10-CM | POA: Insufficient documentation

## 2014-09-16 DIAGNOSIS — E785 Hyperlipidemia, unspecified: Secondary | ICD-10-CM | POA: Insufficient documentation

## 2014-09-16 DIAGNOSIS — Z8249 Family history of ischemic heart disease and other diseases of the circulatory system: Secondary | ICD-10-CM | POA: Diagnosis not present

## 2014-09-16 DIAGNOSIS — R5383 Other fatigue: Secondary | ICD-10-CM | POA: Insufficient documentation

## 2014-09-16 DIAGNOSIS — R600 Localized edema: Secondary | ICD-10-CM

## 2014-09-18 ENCOUNTER — Ambulatory Visit: Admit: 2014-09-18 | Payer: BLUE CROSS/BLUE SHIELD | Admitting: Obstetrics and Gynecology

## 2014-09-18 SURGERY — VULVAR LESION
Anesthesia: Choice

## 2014-09-19 ENCOUNTER — Telehealth: Payer: Self-pay | Admitting: *Deleted

## 2014-09-19 DIAGNOSIS — R002 Palpitations: Secondary | ICD-10-CM

## 2014-09-19 NOTE — Telephone Encounter (Signed)
-----   Message from Chilton Si, MD sent at 09/17/2014 11:51 PM EDT ----- Echo is normal.  There is no explanation for palpitations or leg swelling.

## 2014-09-19 NOTE — Telephone Encounter (Signed)
Spoke to patient. Result given . Verbalized understanding  Patient had a question concerning her palpitations. Patient states her palpitations are becoming more frequent and in clusters-she states that this is new. She did use the propanolol and it did help. Informed patient will defer to Dr Duke Salvia and contact her back

## 2014-09-20 NOTE — Telephone Encounter (Signed)
Please ask Ms. Weiskopf to come in for a 48H Holter.  Hopefully we can catch these palpitations now that they are occurring more frequently.

## 2014-09-23 MED ORDER — PROPRANOLOL HCL 10 MG PO TABS: 10.0000 mg | ORAL_TABLET | Freq: Every evening | ORAL | Status: DC | PRN

## 2014-09-23 NOTE — Telephone Encounter (Signed)
SPOKE TO PATIENT SHE IS AWARE OF THE NEED FOR  48 MONITOR MONITOR ORDER FOR [address] OFFICE.,PATIENT AWARE.  PATIENT ASKED  IF PROPANOLOL CAN BE REFILLED- REFILLED MEDICATIONS

## 2014-09-25 ENCOUNTER — Ambulatory Visit (INDEPENDENT_AMBULATORY_CARE_PROVIDER_SITE_OTHER): Payer: BLUE CROSS/BLUE SHIELD

## 2014-09-25 DIAGNOSIS — R002 Palpitations: Secondary | ICD-10-CM

## 2014-10-09 ENCOUNTER — Ambulatory Visit (HOSPITAL_BASED_OUTPATIENT_CLINIC_OR_DEPARTMENT_OTHER): Payer: BLUE CROSS/BLUE SHIELD | Attending: Cardiovascular Disease | Admitting: *Deleted

## 2014-10-09 VITALS — Ht 64.75 in | Wt 210.0 lb

## 2014-10-09 DIAGNOSIS — I493 Ventricular premature depolarization: Secondary | ICD-10-CM | POA: Insufficient documentation

## 2014-10-09 DIAGNOSIS — G4700 Insomnia, unspecified: Secondary | ICD-10-CM

## 2014-10-09 DIAGNOSIS — G4733 Obstructive sleep apnea (adult) (pediatric): Secondary | ICD-10-CM | POA: Diagnosis not present

## 2014-10-09 DIAGNOSIS — R5383 Other fatigue: Secondary | ICD-10-CM

## 2014-10-09 DIAGNOSIS — R0683 Snoring: Secondary | ICD-10-CM

## 2014-10-09 DIAGNOSIS — G4719 Other hypersomnia: Secondary | ICD-10-CM | POA: Insufficient documentation

## 2014-10-09 DIAGNOSIS — R600 Localized edema: Secondary | ICD-10-CM

## 2014-10-09 DIAGNOSIS — I498 Other specified cardiac arrhythmias: Secondary | ICD-10-CM

## 2014-10-10 ENCOUNTER — Telehealth: Payer: Self-pay | Admitting: *Deleted

## 2014-10-10 NOTE — Telephone Encounter (Signed)
Spoke to patient.  HOLTER  MONITOR Result given . Verbalized understanding REQUEST DR BOZEMAN RECEIVED INFORMATION  INFO ROUTED TO DOCTOR.

## 2014-10-10 NOTE — Sleep Study (Signed)
Patient reported heart palpitations more frequent during the day of the study.

## 2014-10-27 NOTE — Sleep Study (Signed)
NAME: Barbara Rosario DATE OF BIRTH:  22-Mar-1965 MEDICAL RECORD NUMBER [id number]   Study Date: 10/09/2014 Gender: Female D.O.B: 1965/12/22 Age (years): 49 Referring Provider: Skeet Latch Height (inches): 27 Interpreting Physician: Shelva Majestic MD, ABSM Weight (lbs): 210 RPSGT: Gerhard Perches BMI: 35 MRN: [id number] Neck Size: 15.25  CLINICAL INFORMATION Sleep Study Type: Split Night CPAP  Indication for sleep study: OSA, Snoring, Daytime sleepiness  Epworth Sleepiness Score: 14 which is consistent with excessive daytime sleepiness.  SLEEP STUDY TECHNIQUE As per the AASM Manual for the Scoring of Sleep and Associated Events v2.3 (April 2016) with a hypopnea requiring 4% desaturations.  The channels recorded and monitored were frontal, central and occipital EEG, electrooculogram (EOG), submentalis EMG (chin), nasal and oral airflow, thoracic and abdominal wall motion, anterior tibialis EMG, snore microphone, electrocardiogram, and pulse oximetry. Continuous positive airway pressure (CPAP) was initiated when the patient met split night criteria and was titrated according to treat sleep-disordered breathing.  MEDICATIONS  progesterone (PROMETRIUM) 200 MG capsule 200 mg, Daily     propranolol (INDERAL) 10 MG tablet 10 mg, at bedtime and repeat x1 PRN     triamterene-hydrochlorothiazide (MAXZIDE-25) 37.5-25 MG per tablet 1 tablet, Daily   Medications administered by patient during sleep study : No sleep medicine administered.  RESPIRATORY PARAMETERS Diagnostic Total AHI (/hr): 24.5 RDI (/hr): 25.7 OA Index (/hr): 16.9 CA Index (/hr): 0.0 REM AHI (/hr): 0.0 NREM AHI (/hr): 27.0 Supine AHI (/hr): 53.5 Non-supine AHI (/hr): 0.00 Min O2 Sat (%): 88.00 Mean O2 (%): 94.52 Time below 88% (min): 0.1    Titration Optimal Pressure (cm): 13 AHI at Optimal Pressure (/hr): 0.0 Min O2 at Optimal Pressure (%): 95.0 Supine % at Optimal (%): 100 Sleep % at Optimal (%): 100    SLEEP  ARCHITECTURE The recording time for the entire night was 417.3 minutes.  During a baseline period of 211.5 minutes, the patient slept for 195.8 minutes in REM and nonREM, yielding a sleep efficiency of 92.6%. Sleep onset after lights out was 9.2 minutes with a REM latency of 131.0 minutes. The patient spent 4.85% of the night in stage N1 sleep, 51.83% in stage N2 sleep, 34.13% in stage N3 and 9.19% in REM. Wake after sleep onset (WASO): 6.5 minutes.  During the titration period of 198.9 minutes, the patient slept for 131.6 minutes in REM and nonREM, yielding a sleep efficiency of 66.2%. Sleep onset after CPAP initiation was 51.8 minutes with a REM latency of 87.5 minutes. The patient spent 7.60% of the night in stage N1 sleep, 84.80% in stage N2 sleep, 0.00% in stage N3 and 7.60% in REM.  CARDIAC DATA The 2 lead EKG demonstrated sinus rhythm. The mean heart rate was 70.37 beats per minute. Other EKG findings include: PVCs.  LEG MOVEMENT DATA The total Periodic Limb Movements of Sleep (PLMS) were 0. The PLMS index was 0.00 .  IMPRESSIONS Moderate obstructive sleep apnea/hypopnea syndrome (AHI = 24.5/hour); events were worse with supine posture. No significant central sleep apnea occurred during the diagnostic portion of the study (CAI = 0.0/hour). Very mild oxygen desaturation during the diagnostic portion of the study (Min O2 = 88.00%) Loud snoring. EKG findings include PVCs. Clinically significant periodic limb movements did not occur during sleep. An optimal PAP pressure was selected for this patient ( 13 cm of water) with resolution of snoring,oxygen nadir of 95%, and AHI of 0.  DIAGNOSIS Obstructive Sleep Apnea (327.23 [G47.33 ICD-10])  RECOMMENDATIONS Trial of CPAP therapy with EPR of 2  at 13 cm H2O with heated humidification is the initial recommended pressure. Avoid alcohol, sedatives and other CNS depressants that may worsen sleep apnea and disrupt normal sleep  architecture. Sleep hygiene should be reviewed to assess factors that may improve sleep quality. The patient should be counseled to try avoiding sleep in the supine position. Weight management and regular exercise should be initiated or continued in this patient with a BMI of 35. Recommend download in 30 days and sleep clinic evaluation.   [place], American Board of Sleep Medicine  ELECTRONICALLY SIGNED ON:  10/27/2014, 12:53 PM [place] PH: [phone]   FX: [phone] DISH

## 2014-10-30 ENCOUNTER — Telehealth: Payer: Self-pay | Admitting: Cardiovascular Disease

## 2014-10-30 NOTE — Telephone Encounter (Signed)
Spoke with patient informed her the process is in the works to provide her with a C-PAP MACHINE. SHE VERBALIZED UNDERSTANDING.

## 2014-10-30 NOTE — Telephone Encounter (Signed)
Pt says she had a sleep study 3 weeks ago and still have not received her results.

## 2014-10-30 NOTE — Telephone Encounter (Signed)
Dr. Tresa Endo has written sleep study recommendations on 10/2.

## 2014-10-31 ENCOUNTER — Telehealth: Payer: Self-pay | Admitting: *Deleted

## 2014-10-31 NOTE — Telephone Encounter (Signed)
Patient notified of sleep study results and recommendations. Referral sent to [facility]. Once insurance benefits has been verified [facility] will contact her for set up of her therapy. Patient voiced understanding of the next step in this process.

## 2014-10-31 NOTE — Progress Notes (Signed)
Referral sent to [facility]. 

## 2014-11-01 NOTE — Telephone Encounter (Signed)
Thank you.  I don't have access to these recommendations, nor do I order CPAP machines.

## 2014-12-26 ENCOUNTER — Ambulatory Visit (INDEPENDENT_AMBULATORY_CARE_PROVIDER_SITE_OTHER): Payer: BLUE CROSS/BLUE SHIELD | Admitting: Cardiovascular Disease

## 2014-12-26 ENCOUNTER — Encounter: Payer: Self-pay | Admitting: Cardiovascular Disease

## 2014-12-26 VITALS — BP 130/70 | HR 94 | Ht 64.0 in | Wt 220.0 lb

## 2014-12-26 DIAGNOSIS — G4733 Obstructive sleep apnea (adult) (pediatric): Secondary | ICD-10-CM

## 2014-12-26 DIAGNOSIS — R002 Palpitations: Secondary | ICD-10-CM | POA: Diagnosis not present

## 2014-12-26 DIAGNOSIS — Z9989 Dependence on other enabling machines and devices: Principal | ICD-10-CM

## 2014-12-26 NOTE — Patient Instructions (Signed)
Your physician recommends that you schedule a follow-up appointment as  needed with Dr. Kelly for sleep. 

## 2014-12-26 NOTE — Progress Notes (Signed)
Patient ID: Barbara Rosario, female   DOB: [birth year], 49 y.o.   MRN: [id number]   Primary cardiologist: Dr. Skeet Latch  HPI: Barbara Rosario is a 49 y.o. female presents for sleep clinic following initiation of CPAP therapy.  Barbara Rosario but is a 49 year old female who has a history of palpitations as well as some mild lower extremity edema.  She had seen Dr. Oval Linsey August 2016.  At that time, she reported having snoring and awakening herself at night.  She had frequent awakenings and her sleep was nonrestorative.  She reported having 2 prior sleep studies and was never diagnosed with sleep apnea but due to concerns of progressive symptomatology.  She was referred for a recent split-night study, which was done at [facility].  On this present study, the patient make sure that she had adequate supine sleep.  The study was done in a split-night protocol on 10/09/2014 and confirmed moderate obstructive sleep apnea hypopnea syndrome with an AHI of 24.5 per hour.  Events were severe with supine sleep with an AHI of 53.5 per hour.  Her oxygen desaturation nadir was 88%.  On CPAP titration.  She was titrated up to 1370 to water pressure with excellent result.  She is a mouth breather.  She was started on CPAP therapy on 11/11/2014 and has a Respironics DreamStation Auto CPAP unit and has been using a Respironics Dreamwear mask, size small.  Due to oral venting she also has a chin strap.  Since initiating CPAP, she has felt significantly improved.  Previously, she was waking up 4 times per night and now is essentially sleeping all night.  On only one or 2 nights that she get up once.  At times she has had some difficulty with her chinstrap.  She no longer notices nocturnal palpitations.  She denies residual daytime sleepiness.  There've been rare occurrences particular when she is on her back that she feels she is not getting enough pressure, which is been set at a fixed pressure of 13 cm.  She presents for  evaluation.  Epworth Sleepiness Scale: Situation   Chance of Dozing/Sleeping (0 = never , 1 = slight chance , 2 = moderate chance , Rosario = high chance )   sitting and reading 2   watching TV 2   sitting inactive in a public place 1   being a passenger in a motor vehicle for an hour or more 1   lying down in the afternoon 1   sitting and talking to someone 0   sitting quietly after lunch (no alcohol) 1   while stopped for a few minutes in traffic as the driver 0   Total Score  8    Past Medical History  Diagnosis Date  . Tobacco dependence   . Hyperlipemia   . Genital warts   . Palpitations 09/02/2014  . Lower extremity edema 09/02/2014    Past Surgical History  Procedure Laterality Date  . Mastiotympanostomy    . Nasal polyps    . Dilation and curettage of uterus      Allergies  Allergen Reactions  . Codeine Nausea And Vomiting  . Sulfa Antibiotics Hives and Nausea And Vomiting    Current Outpatient Prescriptions  Medication Sig Dispense Refill  . propranolol (INDERAL) 10 MG tablet Take 1 tablet (10 mg total) by mouth at bedtime and may repeat dose one time if needed. 60 tablet 4  . triamterene-hydrochlorothiazide (MAXZIDE-25) 37.5-25 MG per tablet Take 1 tablet by mouth  daily. 90 tablet Rosario   No current facility-administered medications for this visit.    Social History   Social History  . Marital Status: Significant Other    Spouse Name: N/A  . Number of Children: N/A  . Years of Education: N/A   Occupational History  . SELF EMPLOYED     pet grooming [employer]   Social History Main Topics  . Smoking status: Current Every Day Smoker  . Smokeless tobacco: Never Used     Comment: 0.5-1 ppd x 30 years  . Alcohol Use: No  . Drug Use: No  . Sexual Activity: Yes    Birth Control/ Protection: None   Other Topics Concern  . Not on file   Social History Narrative   She is in the office today with her partner.  Family History  Problem Relation Age of Onset  .  Cancer Father   . Heart attack Paternal Grandfather   . Heart disease Paternal Grandfather     Family history is notable that her.father died at age 28 with cancer.  Mother still living and has had almost lifelong polio.  She has 2 siblings.  No family members have CPAP or known obstructive sleep apnea.   ROS General: Negative; No fevers, chills, or night sweats HEENT: Negative; No changes in vision or hearing, sinus congestion, difficulty swallowing Pulmonary: Negative; No cough, wheezing, shortness of breath, hemoptysis Cardiovascular: Negative; No chest pain, presyncope, syncope, palpatations GI: Negative; No nausea, vomiting, diarrhea, or abdominal pain GU: Negative; No dysuria, hematuria, or difficulty voiding Musculoskeletal: Negative; no myalgias, joint pain, or weakness Hematologic: Negative; no easy bruising, bleeding Endocrine: Negative; no heat/cold intolerance Neuro: Negative; no changes in balance, headaches Skin: Negative; No rashes or skin lesions Psychiatric: Negative; No behavioral problems, depression Sleep: Negative; No daytime sleepiness, hypersomnolence, bruxism, restless legs, hypnogognic hallucinations, no cataplexy   Physical Exam BP 130/70 mmHg  Pulse 94  Ht 5' 4" (1.626 m)  Wt 220 lb (99.791 kg)  BMI 37.74 kg/m2  SpO2 97%  Wt Readings from Last Rosario Encounters:  12/26/14 220 lb (99.791 kg)  10/09/14 210 lb (95.255 kg)  09/02/14 208 lb 12.8 oz (94.711 kg)   General: Alert, oriented, no distress.  Skin: normal turgor, no rashes HEENT: Normocephalic, atraumatic. Pupils round and reactive; sclera anicteric; extraocular muscles intact; Fundi without hemorrhages or exudates. Nose without nasal septal hypertrophy Mouth/Parynx benign; Mallinpatti scale Rosario Neck: No JVD, no carotid briuts Lungs: clear to ausculatation and percussion; no wheezing or rales  Chest wall: No tenderness to palpation Heart: RRR, s1 s2 normal; no ectopy Abdomen: soft, nontender; no  hepatosplenomehaly, BS+; abdominal aorta nontender and not dilated by palpation. Back: No CVA tenderness Pulses 2+ Extremities: Trivial edema; no clubbing cyanosis, Homan's sign negative  Neurologic: grossly nonfocal; cranial nerves intact. Psychological: Normal affect and mood.  ECG not done today, but reviewed from 09/02/14  CPAP download from 11/25/2014 through 12/24/2014: Excellent compliance with 100% of days used at an average of 7 hours and 37 minutes.  AHI was excellent at 1.8 at 13 cm CPAP pressure.  LABS:  BMP Latest Ref Rng 06/21/2014 10/01/2010 08/08/2008  Glucose 70 - 99 mg/dL 117(H) 96 102(H)  BUN 6 - 23 mg/dL _0 Creatinine 0.50 - 1.10 mg/dL 0.69 0.80 0.67  Sodium 135 - 145 mEq/L 139 141 138  Potassium Rosario.5 - 5.Rosario mEq/L 4.1 Rosario.5 Rosario.9  Chloride 96 - 112 mEq/L 107 106 106  CO2 19 - 32 mEq/L 23 -  27  Calcium 8.4 - 10.5 mg/dL 8.9 - 9.2     Hepatic Function Latest Ref Rng 06/21/2014  Total Protein 6.0 - 8.Rosario g/dL 6.6  Albumin Rosario.5 - 5.2 g/dL 4.0  AST 0 - 37 U/L 14  ALT 0 - 35 U/L 10  Alk Phosphatase 39 - 117 U/L 62  Total Bilirubin 0.2 - 1.2 mg/dL 0.4     CBC Latest Ref Rng 10/01/2010 10/01/2010 08/08/2008  WBC 4.0 - 10.5 K/uL - 9.5 7.5  Hemoglobin 12.0 - 15.0 g/dL 14.Rosario 14.0 13.4  Hematocrit 36.0 - 46.0 % 42.0 39.5 39.2  Platelets 150 - 400 K/uL - 243 211     Lipid Panel  No results found for: CHOL, TRIG, HDL, CHOLHDL, VLDL, LDLCALC, LDLDIRECT   RADIOLOGY: No results found.    ASSESSMENT AND PLAN: Barbara Rosario is a 49 year old female who was found to have moderate sleep apnea.  Overall, but severe sleep apnea during supine sleep on her most recent split-night study evaluation.  Since initiating CPAP therapy, she has had resolution of prior excessive daytime sleepiness and she has had resolution of snoring.  There've been several incidences where she felt she was not getting enough air, particularly when she was lying supine.  Times, when she is lying on her  side.  She may be feel that the areas too much.  Clinically, she feels significantly improved and is now sleeping through the night.  She is excellent with reference to meeting compliance standards.  I long discussion with her today concerning her sleep study and CPAP.  With her significant positional component, I will change her to a CPAP auto set up initiating at 7 cm water pressure with a maximum potentially up to 20 cm water if necessary.  We also discussed different chinstrap scenarios.  Her nocturnal palpitations have resolved with therapy.  A download will be obtained in 30 days.  On her auto unit to make certain into the 2 as well.  As long as she is stable, I'll be available on an as-needed basis from a sleep perspective.  She will follow-up with Dr. Oval Linsey for cardiologic follow-up.   Time spent: 25 minutes  Troy Sine, MD, [facility]  12/26/2014 4:54 PM

## 2015-01-21 ENCOUNTER — Telehealth: Payer: Self-pay | Admitting: Cardiovascular Disease

## 2015-01-21 NOTE — Telephone Encounter (Signed)
Patient said that she was having real good results being free from palpitations for 6 weeks after her CPAP was placed into auto mode but not she is experiencing palpitations quite frequently Remains on Inderal 10 mg.   Wonders what she should do about her CPAP pressure settings; routed to Dr. Tresa EndoKelly Made an appointment with Dr. Duke Salviaandolph 12/29 at 3:30pm about her bothersome frequent palpitations for possible medication adjustment and evaluation

## 2015-01-21 NOTE — Telephone Encounter (Signed)
Please call,concerning the pressure of her C-Pap machine.

## 2015-01-23 NOTE — Telephone Encounter (Signed)
Gave patient the message from Dr. Tresa EndoKelly to keep the CPAP on Auto and it will self regulate depending on her needs during the night Patient said that she really doesn't want to be on beta blockers.

## 2015-01-23 NOTE — Telephone Encounter (Signed)
Continue CPAP Auto; will self regulate depending on her need throughout the night.  Prob need to increase BB dose or change to different BB; to see Dr. Horris Latinoandolf

## 2015-01-24 ENCOUNTER — Ambulatory Visit (INDEPENDENT_AMBULATORY_CARE_PROVIDER_SITE_OTHER): Payer: BLUE CROSS/BLUE SHIELD | Admitting: Cardiovascular Disease

## 2015-01-24 ENCOUNTER — Encounter: Payer: Self-pay | Admitting: Cardiovascular Disease

## 2015-01-24 VITALS — BP 134/76 | HR 87 | Ht 65.0 in | Wt 220.0 lb

## 2015-01-24 DIAGNOSIS — E785 Hyperlipidemia, unspecified: Secondary | ICD-10-CM | POA: Diagnosis not present

## 2015-01-24 DIAGNOSIS — R002 Palpitations: Secondary | ICD-10-CM

## 2015-01-24 DIAGNOSIS — I10 Essential (primary) hypertension: Secondary | ICD-10-CM

## 2015-01-24 DIAGNOSIS — I493 Ventricular premature depolarization: Secondary | ICD-10-CM

## 2015-01-24 MED ORDER — PROPRANOLOL HCL 20 MG PO TABS: 20.0000 mg | ORAL_TABLET | Freq: Three times a day (TID) | ORAL | Status: DC

## 2015-01-24 NOTE — Progress Notes (Signed)
Cardiology Office Note   Date:  01/27/2015   ID:  Thana Ates, DOB 1965/06/24, MRN [id number]  PCP:  Elvina Sidle, MD  Cardiologist:   Madilyn Hook, MD   Chief Complaint  Patient presents with  . Follow-up  . Palpitations  . Chest Pain    BURNING AND PAIN IN LEFT LEG    Patient ID: Barbara Rosario is a 49 y.o. female who presents for follow up on PVCs.   Interval History 01/24/15: After her last appointment, Barbara Rosario had an echo that was unremarkable.  48 hour Holter showed frequent PVCs (52 in 48 hours).  She had a sleep study that revealed moderate sleep apnea.  She was started on a CPAP by Dr. Tresa Endo.  She was doing well until recently when she noticed increased palpitations.  After starting the CPAP she had no palpitations.  She switched from continuous pressure to auto-triggered on 12/2.  After that she has noted increased night awakening and increased palpitations.  She contacted Dr. Tresa Endo to switch back to continuous, but he preferred that she stay on auto titration.  Barbara Rosario has not been exercising much lately.  Until a few weeks ago she had been going ot the gym.  She walked for 15-20 minutes on a treadmill followed by lifting weights and then did cardio for cool down. She has been very busy at work and has been unable to go to the gym lately. She reports a lot of stress from work. 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  She has also been limited by pain in the back of her leg.  Barbara Rosario is looking forward to an upcoming cruise for work.   History of Present Illness 09/02/14:Barbara Rosario presents due to symptomatic palpitations.  This has been an ongoing issue for over 15 years. However around January 4. Approximately 3 months the palpitations were much more frequent. It is occurring daily with episodes lasting for a few seconds at a time. She does have occasional episodes that last longer, up to an hour. She was evaluated  by her primary care physician and started taking propranolol when necessary. She decided to see her PCP at that time because of lower extremity edema that was a new symptom. She was started on triamterene/HCTZ and lost 9 pounds in 3 days. Since then she does not note significant edema unless she is traveling or on her feet for prolonged periods of time. The edema improves I morning.  After seeing her PCP she was taking propranolol daily and the episodes decreased in frequency.  She now has palpitations once every  1.5 weeks.  Barbara Rosario was evaluated for palpitations 5-6 years ago.  She had a negative stress test and her Holter did not reveal any arrhythmias despite her triggering the monitor for events.  Barbara Rosario exercises 3 times weekly with exercise videos. She does not develop palpitations, chest pain or shortness of breath. She eats a healthy, gluten-free, platelet base diet but still has difficulty losing weight.  In addition to her primary care provider she also sees an integrative medicine provider who previously had her on thyroid supplementation for low normal thyroid function.  She stopped taking this medicine around January or February. She also was taking estrogen therapy for perimenopausal symptoms and this was stopped in December. She is being treated for high lead levels and recently completed 60 days of chelation therapy. Her integrative physician also is treating her  for EBV positivity with antiviral supplementation.  Barbara Rosario continues to smoke 6 cigarettes daily.  She is also using E cigarettes. She started smoking as a teen and smoked up to 1.5 packs per day and her highest.  She's previously tried Wellbutrin though this cause panic attacks. she was prescribed Chantix but is afraid to take it due to potential side effects. Nicotine throat lozenges burned her throat. She lives with a smoker and this has made it more challenging for her to quit.   Pt reports  heavy snoring and  awakening herself at night. She has a recording where she did not breathe for several seconds. She previously underwent a sleep study  that was negative for sleep apnea. However she is concerned that this is because she did not actually fall into a deep sleep that night.   Past Medical History  Diagnosis Date  . Tobacco dependence   . Hyperlipemia   . Genital warts   . Palpitations 09/02/2014  . Lower extremity edema 09/02/2014  . Essential hypertension 01/27/2015    Past Surgical History  Procedure Laterality Date  . Mastiotympanostomy    . Nasal polyps    . Dilation and curettage of uterus       Current Outpatient Prescriptions  Medication Sig Dispense Refill  . propranolol (INDERAL) 20 MG tablet Take 1 tablet (20 mg total) by mouth 3 (three) times daily. 90 tablet 11  . triamterene-hydrochlorothiazide (MAXZIDE-25) 37.5-25 MG per tablet Take 1 tablet by mouth daily. 90 tablet 3   No current facility-administered medications for this visit.    Allergies:   Codeine and Sulfa antibiotics    Social History:  The patient  reports that she has been smoking.  She has never used smokeless tobacco. She reports that she does not drink alcohol or use illicit drugs.   Family History:  The patient's  family history includes Cancer in her father; Heart attack in her paternal grandfather; Heart disease in her paternal grandfather.    ROS:  Please see the history of present illness.   Otherwise, review of systems are positive for none.   All other systems are reviewed and negative.    PHYSICAL EXAM: VS:  BP 134/76 mmHg  Pulse 87  Ht 5\' 5"  (1.651 m)  Wt 99.791 kg (220 lb)  BMI 36.61 kg/m2 , BMI Body mass index is 36.61 kg/(m^2). GENERAL:  Well appearing HEENT:  Pupils equal round and reactive, fundi not visualized, oral mucosa unremarkable NECK:  No jugular venous distention, waveform within normal limits, carotid upstroke brisk and symmetric, no bruits, no thyromegaly LYMPHATICS:  No  cervical adenopathy LUNGS:  Clear to auscultation bilaterally HEART:  RRR.  PMI not displaced or sustained,S1 and S2 within normal limits, no S3, no S4, no clicks, no rubs, no murmurs ABD:  Flat, positive bowel sounds normal in frequency in pitch, no bruits, no rebound, no guarding, no midline pulsatile mass, no hepatomegaly, no splenomegaly EXT:  2 plus pulses throughout, no edema, no cyanosis no clubbing SKIN:  No rashes no nodules NEURO:  Cranial nerves II through XII grossly intact, motor grossly intact throughout PSYCH:  Cognitively intact, oriented to person place and time   EKG:  EKG is ordered today. The ekg ordered today demonstrates sinus rhythm at 87 bpm.  Poor R wave progression.   Echo 09/16/14: Study Conclusions  - Left ventricle: The cavity size was normal. Systolic function was normal. The estimated ejection fraction was in the range of 50% to  55%.  48 Hour Holter Monitor 09/25/14   Quality: Fair. Baseline artifact. Predominant rhythm: sinus Average heart rate: 88 bpm Max heart rate: 117 bpm Min heart rate: 63 bpm Pauses >2.5 seconds: 0 Ventricular ectopics: 52 (48 isolated, 0 bigemeny, 2 couplets)  Percentage of ectopic beats: 0.02% Morphology: monomorphic Supraventricular ectopics: 5 Patient did not submit a symptom diary  Recent Labs: 06/21/2014: ALT 10; BUN 13; Creat 0.69; Potassium 4.1; Sodium 139; TSH 2.754    Lipid Panel No results found for: CHOL, TRIG, HDL, CHOLHDL, VLDL, LDLCALC, LDLDIRECT    Wt Readings from Last 3 Encounters:  01/24/15 99.791 kg (220 lb)  12/26/14 99.791 kg (220 lb)  10/09/14 95.255 kg (210 lb)      Other studies Reviewed: Additional studies/ records that were reviewed today include: medical record. Review of the above records demonstrates:  Please see elsewhere in the note.     ASSESSMENT AND PLAN:  # Palpitations: Monitor shows occasional PVCs, though she reports her symptoms are worse than when she was wearing  the monitor.  We will increase propranolol to 20 mg 3 times daily. She will also continue to wear her CPAP machine, which she thinks has improved her palpitations.  Echo was unremarkable.  # Hypertension: Blood pressure well-controlled.  Continue Maxzide.  # OSA: Continue CPAP as ordered by Dr. Tresa Endo.  Current medicines are reviewed at length with the patient today.  The patient does not have concerns regarding medicines.  The following changes have been made:  no change  Labs/ tests ordered today include: none  Orders Placed This Encounter  Procedures  . EKG 12-Lead     Disposition:   FU with Dr. Elmarie Shiley C. [place] in 6 months    Signed, Madilyn Hook, MD  01/27/2015 11:45 AM    [facility]

## 2015-01-24 NOTE — Patient Instructions (Signed)
Dr Duke Salviaandolph has recommended making the following medication changes: INCREASE Propranolol to 20 mg - take 1 tablet by mouth daily.  >>You may take 2 10 mg tablets until they run out  >>A new prescription has been sent to your pharmacy electronically  Your physician recommends that you schedule a follow-up appointment in 6 months. You will receive a reminder letter in the mail two months in advance. If you don't receive a letter, please call our office to schedule the follow-up appointment.  If you need a refill on your cardiac medications before your next appointment, please call your pharmacy.

## 2015-01-27 ENCOUNTER — Encounter: Payer: Self-pay | Admitting: Cardiovascular Disease

## 2015-01-27 DIAGNOSIS — I10 Essential (primary) hypertension: Secondary | ICD-10-CM

## 2015-01-27 HISTORY — DX: Essential (primary) hypertension: I10

## 2015-02-13 ENCOUNTER — Telehealth: Payer: Self-pay | Admitting: Cardiovascular Disease

## 2015-02-13 NOTE — Telephone Encounter (Signed)
Pt needs a new prescription for her C Pap mask. Please fax to [email or web address].

## 2015-02-18 NOTE — Telephone Encounter (Signed)
Faxed order for respironics dreamwear mask -size small to CPAP. Com  @ [phone] per patient request.

## 2015-03-17 ENCOUNTER — Ambulatory Visit: Payer: BLUE CROSS/BLUE SHIELD | Admitting: Cardiovascular Disease

## 2015-05-21 DIAGNOSIS — M5408 Panniculitis affecting regions of neck and back, sacral and sacrococcygeal region: Secondary | ICD-10-CM | POA: Diagnosis not present

## 2015-05-21 DIAGNOSIS — M9901 Segmental and somatic dysfunction of cervical region: Secondary | ICD-10-CM | POA: Diagnosis not present

## 2015-05-21 DIAGNOSIS — M9902 Segmental and somatic dysfunction of thoracic region: Secondary | ICD-10-CM | POA: Diagnosis not present

## 2015-05-21 DIAGNOSIS — M9903 Segmental and somatic dysfunction of lumbar region: Secondary | ICD-10-CM | POA: Diagnosis not present

## 2015-06-25 DIAGNOSIS — M9902 Segmental and somatic dysfunction of thoracic region: Secondary | ICD-10-CM | POA: Diagnosis not present

## 2015-06-25 DIAGNOSIS — M9903 Segmental and somatic dysfunction of lumbar region: Secondary | ICD-10-CM | POA: Diagnosis not present

## 2015-06-25 DIAGNOSIS — M9901 Segmental and somatic dysfunction of cervical region: Secondary | ICD-10-CM | POA: Diagnosis not present

## 2015-06-25 DIAGNOSIS — M5408 Panniculitis affecting regions of neck and back, sacral and sacrococcygeal region: Secondary | ICD-10-CM | POA: Diagnosis not present

## 2015-07-23 DIAGNOSIS — M9902 Segmental and somatic dysfunction of thoracic region: Secondary | ICD-10-CM | POA: Diagnosis not present

## 2015-07-23 DIAGNOSIS — M9903 Segmental and somatic dysfunction of lumbar region: Secondary | ICD-10-CM | POA: Diagnosis not present

## 2015-07-23 DIAGNOSIS — M5408 Panniculitis affecting regions of neck and back, sacral and sacrococcygeal region: Secondary | ICD-10-CM | POA: Diagnosis not present

## 2015-08-18 ENCOUNTER — Encounter: Payer: Self-pay | Admitting: Cardiovascular Disease

## 2015-08-18 ENCOUNTER — Ambulatory Visit (INDEPENDENT_AMBULATORY_CARE_PROVIDER_SITE_OTHER): Payer: BLUE CROSS/BLUE SHIELD | Admitting: Cardiovascular Disease

## 2015-08-18 VITALS — BP 126/68 | HR 92 | Ht 65.0 in | Wt 231.0 lb

## 2015-08-18 DIAGNOSIS — Z720 Tobacco use: Secondary | ICD-10-CM

## 2015-08-18 DIAGNOSIS — I493 Ventricular premature depolarization: Secondary | ICD-10-CM | POA: Diagnosis not present

## 2015-08-18 DIAGNOSIS — I10 Essential (primary) hypertension: Secondary | ICD-10-CM | POA: Diagnosis not present

## 2015-08-18 DIAGNOSIS — E785 Hyperlipidemia, unspecified: Secondary | ICD-10-CM

## 2015-08-18 HISTORY — DX: Ventricular premature depolarization: I49.3

## 2015-08-18 HISTORY — DX: Tobacco use: Z72.0

## 2015-08-18 NOTE — Progress Notes (Signed)
Cardiology Office Note   Date:  08/18/2015   ID:  Barbara Rosario, DOB 16-Aug-1965, MRN [id number]  PCP:  No PCP Per Patient  Cardiologist:   Chilton Si, MD   Chief Complaint  Patient presents with  . Follow-up    edema; in ankles    History of Present Illness: Barbara Rosario is a 50 y.o. female with hyperlipidemia who presents for follow up on PVCs. Barbara Rosario was first seen 08/2014 with palpitations that had been ongoing for 15 years.  She saw her PCP and was started on proranolol, which helped the symptoms.  She work a 48 hour Holter 08/2014 that showed frequent PVCs (52 in 48 hours).  She had a sleep study that revealed moderate sleep apnea.  She was started on a CPAP by Dr. Tresa Endo.    Barbara Rosario reports that her palpitations Have been much better-controlled lately. In January she was having the palpitations constantly. She stopped all her supplements and gradually reintroduce them. She notes that her estrogen seemed to be causing palpitations. She is no longer taking this and now notes palpitations only very rarely. Typically when it does occur it happens at night and only lasts for a few seconds. She is also started a strict Paleo diet.  Since doing this she has lost 12 pounds. She is not exercising regularly. She also continues to smoke. She is afraid to try and quit smoking until she loses more weight. She's also afraid of trying Chantix or Wellbutrin  and is not interested in nicotine replacement therapy. She thinks that eventually she will just be ready to quit cold [place].  Barbara Rosario denies any chest pain, shortness of breath, lower extremity edema, orthopnea or PND. She has been checking her blood pressure at home and it is typically been in the 120s over 70s. She has not been taking either her antihypertensives.  Past Medical History:  Diagnosis Date  . Essential hypertension 01/27/2015  . Genital warts   . Hyperlipemia   . Lower extremity edema 09/02/2014  .  Palpitations 09/02/2014  . PVC's (premature ventricular contractions) 08/18/2015  . Tobacco abuse 08/18/2015  . Tobacco dependence     Past Surgical History:  Procedure Laterality Date  . DILATION AND CURETTAGE OF UTERUS    . mastiotympanostomy    . nasal polyps       Current Outpatient Prescriptions  Medication Sig Dispense Refill  . propranolol (INDERAL) 20 MG tablet Take 1 tablet (20 mg total) by mouth 3 (three) times daily. 90 tablet 11  . triamterene-hydrochlorothiazide (MAXZIDE-25) 37.5-25 MG per tablet Take 1 tablet by mouth daily. 90 tablet 3   No current facility-administered medications for this visit.     Allergies:   Codeine and Sulfa antibiotics    Social History:  The patient  reports that she has been smoking.  She has never used smokeless tobacco. She reports that she does not drink alcohol or use drugs.   Family History:  The patient's  family history includes Cancer in her father; Heart attack in her paternal grandfather; Heart disease in her paternal grandfather.    ROS:  Please see the history of present illness.   Otherwise, review of systems are positive for none.   All other systems are reviewed and negative.    PHYSICAL EXAM: VS:  BP 126/68   Pulse 92   Ht  (1.651 m)   Wt 231 lb (104.8 kg)   BMI 38.44 kg/m  , BMI Body  mass index is 38.44 kg/m. GENERAL:  Well appearing HEENT:  Pupils equal round and reactive, fundi not visualized, oral mucosa unremarkable NECK:  No jugular venous distention, waveform within normal limits, carotid upstroke brisk and symmetric, no bruits, no thyromegaly LYMPHATICS:  No cervical adenopathy LUNGS:  Clear to auscultation bilaterally HEART:  RRR.  PMI not displaced or sustained,S1 and S2 within normal limits, no S3, no S4, no clicks, no rubs, no murmurs ABD:  Flat, positive bowel sounds normal in frequency in pitch, no bruits, no rebound, no guarding, no midline pulsatile mass, no hepatomegaly, no splenomegaly EXT:   2 plus pulses throughout, no edema, no cyanosis no clubbing SKIN:  No rashes no nodules NEURO:  Cranial nerves II through XII grossly intact, motor grossly intact throughout PSYCH:  Cognitively intact, oriented to person place and time   EKG:  EKG is ordered today. The ekg ordered today demonstrates sinus rhythm rate 92 bpm.  Echo 09/16/14: Study Conclusions  - Left ventricle: The cavity size was normal. Systolic function was normal. The estimated ejection fraction was in the range of 50% to 55%.  48 Hour Holter Monitor 09/25/14   Quality: Fair. Baseline artifact. Predominant rhythm: sinus Average heart rate: 88 bpm Max heart rate: 117 bpm Min heart rate: 63 bpm Pauses >2.5 seconds: 0 Ventricular ectopics: 52 (48 isolated, 0 bigemeny, 2 couplets)  Percentage of ectopic beats: 0.02% Morphology: monomorphic Supraventricular ectopics: 5 Patient did not submit a symptom diary  Recent Labs: No results found for requested labs within last 8760 hours.    Lipid Panel No results found for: CHOL, TRIG, HDL, CHOLHDL, VLDL, LDLCALC, LDLDIRECT    Wt Readings from Last 3 Encounters:  08/18/15 231 lb (104.8 kg)  01/24/15 220 lb (99.8 kg)  12/26/14 220 lb (99.8 kg)      Other studies Reviewed: Additional studies/ records that were reviewed today include: medical record. Review of the above records demonstrates:  Please see elsewhere in the note.     ASSESSMENT AND PLAN:  # Palpitations: Noted to have PVCs on Holter monitor.  She no longer has palpitations since stopping her estrogen supplementation.  Propranolol was also discontinued.  She does note that they sometimes occur after eating certain foods. It is possible that GERD could be a trigger.   # Hypertension: Blood pressure well-controlled off all medications. As is likely due to weight loss. She was encouraged to keep working on this.   # OSA: Continue CPAP as ordered by Dr. Tresa Endo.  # Hyperlipidemia: [name]  has a history of hyperlipidemia and has not had her lipids checked in several years. We will check a fasting lipid panel today.   # Tobacco abuse: Patient was encouraged to stop smoking but is not interested at this time. 5 minutes were spent discussing tobacco cessation.   Current medicines are reviewed at length with the patient today.  The patient does not have concerns regarding medicines.  The following changes have been made:  no change  Labs/ tests ordered today include: none  Orders Placed This Encounter  Procedures  . Lipid panel  . EKG 12-Lead     Disposition:   FU with Dr. Elmarie Shiley C. Duke Salvia in 1 year   Signed, Chilton Si, MD  08/18/2015 10:11 AM    [facility]

## 2015-08-18 NOTE — Patient Instructions (Signed)
Dr Duke Salvia recommends that you continue on your current medications as directed. Please refer to the Current Medication list given to you today.  Your physician recommends that you return for lab work at your earliest convenience - FASTING.  Dr Duke Salvia recommends that you schedule a follow-up appointment in 1 year. You will receive a reminder letter in the mail two months in advance. If you don't receive a letter, please call our office to schedule the follow-up appointment.  If you need a refill on your cardiac medications before your next appointment, please call your pharmacy.

## 2015-08-20 LAB — LIPID PANEL
CHOL/HDL RATIO: 5.5 ratio — AB (ref <=5.0)
CHOLESTEROL: 244 mg/dL — AB (ref 125–200)
HDL: 44 mg/dL — AB (ref >=46)
LDL Cholesterol: 177 mg/dL — ABNORMAL HIGH (ref <130)
TRIGLYCERIDES: 116 mg/dL (ref <150)
VLDL: 23 mg/dL (ref <30)

## 2015-08-28 DIAGNOSIS — M9901 Segmental and somatic dysfunction of cervical region: Secondary | ICD-10-CM | POA: Diagnosis not present

## 2015-08-28 DIAGNOSIS — M5408 Panniculitis affecting regions of neck and back, sacral and sacrococcygeal region: Secondary | ICD-10-CM | POA: Diagnosis not present

## 2015-08-28 DIAGNOSIS — M9902 Segmental and somatic dysfunction of thoracic region: Secondary | ICD-10-CM | POA: Diagnosis not present

## 2015-08-28 DIAGNOSIS — M9903 Segmental and somatic dysfunction of lumbar region: Secondary | ICD-10-CM | POA: Diagnosis not present

## 2015-09-08 DIAGNOSIS — G4733 Obstructive sleep apnea (adult) (pediatric): Secondary | ICD-10-CM | POA: Diagnosis not present

## 2015-09-09 ENCOUNTER — Encounter: Payer: Self-pay | Admitting: *Deleted

## 2015-09-09 ENCOUNTER — Telehealth: Payer: Self-pay | Admitting: *Deleted

## 2015-09-09 DIAGNOSIS — E7800 Pure hypercholesterolemia, unspecified: Secondary | ICD-10-CM

## 2015-09-09 NOTE — Telephone Encounter (Signed)
-----   Message from Chilton Siiffany [place], MD sent at 08/29/2015  4:58 PM EDT ----- Cholesterol levels are elevated.  12% risk of heart attack or stroke in the next 10 years.  This could be limited to 1.9% with risk factor modification such as smoke cessation, aspirin 81mg .  Recommend working on her diet by limiting fried and fatty foods.  Options are to either start aspirin 81 mg and atorvastatin 40 mg daily or working on her diet and increasing exercise before starting medication.  Either way, repeat lipids in 2 months.

## 2015-09-09 NOTE — Telephone Encounter (Signed)
  Advised patient She is willing to start Aspirin 81 mg daily but not a statin, will recheck labs in 2 months

## 2015-09-24 DIAGNOSIS — M9903 Segmental and somatic dysfunction of lumbar region: Secondary | ICD-10-CM | POA: Diagnosis not present

## 2015-09-24 DIAGNOSIS — M9902 Segmental and somatic dysfunction of thoracic region: Secondary | ICD-10-CM | POA: Diagnosis not present

## 2015-09-24 DIAGNOSIS — M5408 Panniculitis affecting regions of neck and back, sacral and sacrococcygeal region: Secondary | ICD-10-CM | POA: Diagnosis not present

## 2015-09-24 DIAGNOSIS — M9901 Segmental and somatic dysfunction of cervical region: Secondary | ICD-10-CM | POA: Diagnosis not present

## 2015-10-09 DIAGNOSIS — G4733 Obstructive sleep apnea (adult) (pediatric): Secondary | ICD-10-CM | POA: Diagnosis not present

## 2015-10-29 DIAGNOSIS — M5408 Panniculitis affecting regions of neck and back, sacral and sacrococcygeal region: Secondary | ICD-10-CM | POA: Diagnosis not present

## 2015-10-29 DIAGNOSIS — M9902 Segmental and somatic dysfunction of thoracic region: Secondary | ICD-10-CM | POA: Diagnosis not present

## 2015-10-29 DIAGNOSIS — M9901 Segmental and somatic dysfunction of cervical region: Secondary | ICD-10-CM | POA: Diagnosis not present

## 2015-10-29 DIAGNOSIS — M9903 Segmental and somatic dysfunction of lumbar region: Secondary | ICD-10-CM | POA: Diagnosis not present

## 2015-11-04 ENCOUNTER — Telehealth: Payer: Self-pay | Admitting: Cardiovascular Disease

## 2015-11-04 DIAGNOSIS — Z79899 Other long term (current) drug therapy: Secondary | ICD-10-CM

## 2015-11-04 DIAGNOSIS — E785 Hyperlipidemia, unspecified: Secondary | ICD-10-CM

## 2015-11-04 NOTE — Telephone Encounter (Signed)
Barbara Rosario is calling because she is wanting to know if a lab that shows the density of the cholesterol partials be added . Please call    Thanks

## 2015-11-04 NOTE — Telephone Encounter (Signed)
Would this be OK to order? Note that [facility] will perform [facility] ALP, but no longer runs the NMR Lipid Profile.

## 2015-11-06 NOTE — Telephone Encounter (Signed)
Doesn't matter to me.  I don't really use this additional info, but if she wants it, fine.

## 2015-11-12 NOTE — Telephone Encounter (Signed)
Advised patient Added CMET at patients request since she currently has no PCP, diagnosis to cover

## 2015-11-22 DIAGNOSIS — H6691 Otitis media, unspecified, right ear: Secondary | ICD-10-CM | POA: Diagnosis not present

## 2015-12-01 DIAGNOSIS — E785 Hyperlipidemia, unspecified: Secondary | ICD-10-CM | POA: Diagnosis not present

## 2015-12-01 DIAGNOSIS — Z79899 Other long term (current) drug therapy: Secondary | ICD-10-CM | POA: Diagnosis not present

## 2015-12-01 LAB — COMPREHENSIVE METABOLIC PANEL
ALBUMIN: 4.3 g/dL (ref 3.6–5.1)
ALK PHOS: 55 U/L (ref 33–115)
ALT: 14 U/L (ref 6–29)
AST: 13 U/L (ref 10–35)
BILIRUBIN TOTAL: 0.4 mg/dL (ref 0.2–1.2)
BUN: 14 mg/dL (ref 7–25)
CALCIUM: 9.3 mg/dL (ref 8.6–10.2)
CO2: 25 mmol/L (ref 20–31)
CREATININE: 0.75 mg/dL (ref 0.50–1.10)
Chloride: 106 mmol/L (ref 98–110)
Glucose, Bld: 102 mg/dL — ABNORMAL HIGH (ref 65–99)
Potassium: 4.3 mmol/L (ref 3.5–5.3)
SODIUM: 139 mmol/L (ref 135–146)
TOTAL PROTEIN: 6.7 g/dL (ref 6.1–8.1)

## 2015-12-03 DIAGNOSIS — M9903 Segmental and somatic dysfunction of lumbar region: Secondary | ICD-10-CM | POA: Diagnosis not present

## 2015-12-03 DIAGNOSIS — M9901 Segmental and somatic dysfunction of cervical region: Secondary | ICD-10-CM | POA: Diagnosis not present

## 2015-12-03 DIAGNOSIS — M9902 Segmental and somatic dysfunction of thoracic region: Secondary | ICD-10-CM | POA: Diagnosis not present

## 2015-12-03 DIAGNOSIS — M5408 Panniculitis affecting regions of neck and back, sacral and sacrococcygeal region: Secondary | ICD-10-CM | POA: Diagnosis not present

## 2015-12-04 LAB — CARDIO IQ(R) ADVANCED LIPID PANEL
APOLIPOPROTEIN (CARDIO IQ ADV LIPID PANEL): 121 mg/dL — AB (ref 49–103)
CHOLESTEROL, TOTAL (CARDIO IQ ADV LIPID PANEL): 245 mg/dL — AB (ref <200)
Cholesterol/HDL Ratio: 5.7 calc — ABNORMAL HIGH (ref <5.0)
HDL Cholesterol: 43 mg/dL — ABNORMAL LOW (ref >50)
LDL LARGE: 3593 nmol/L — AB (ref 5038–17886)
LDL MEDIUM: 283 nmol/L (ref 121–397)
LDL PARTICLE NUMBER: 1548 nmol/L (ref 1016–2185)
LDL Peak Size: 226.7 Angstrom (ref >=218.2)
LDL SMALL: 140 nmol/L (ref 115–386)
LDL, Calculated: 180 mg/dL — ABNORMAL HIGH (ref <100)
LIPOPROTEIN (A) (CARDIO IQ ADV LIPID PANEL): 131 nmol/L — AB (ref <75)
NON-HDL CHOLESTEROL (CARDIO IQ ADV LIPID PANEL): 202 mg/dL — AB (ref <130)
Triglycerides: 103 mg/dL (ref <150)

## 2015-12-16 ENCOUNTER — Telehealth: Payer: Self-pay | Admitting: *Deleted

## 2015-12-16 DIAGNOSIS — E785 Hyperlipidemia, unspecified: Secondary | ICD-10-CM

## 2015-12-16 NOTE — Telephone Encounter (Deleted)
-----   Message from Chilton Siiffany [place], MD sent at 12/15/2015  3:51 PM EST ----- Cholesterol levels remain elevated.  If she still doesn't want to try a statin, recommend working on her diet by reducing fried and fatty foods, cheese and saturated fats.  Also exercise 30-40 minutes most days of the week.  If she is willing, start rosuvastatin 10mg  daily and repeat lipids and CMP in 6 weeks.

## 2015-12-16 NOTE — Telephone Encounter (Signed)
Left message to call back  

## 2015-12-16 NOTE — Telephone Encounter (Signed)
-----   Message from Tiffany Cleona, MD sent at 12/15/2015  3:51 PM EST ----- Cholesterol levels remain elevated.  If she still doesn't want to try a statin, recommend working on her diet by reducing fried and fatty foods, cheese and saturated fats.  Also exercise 30-40 minutes most days of the week.  If she is willing, start rosuvastatin 10mg daily and repeat lipids and CMP in 6 weeks. 

## 2015-12-31 DIAGNOSIS — M9903 Segmental and somatic dysfunction of lumbar region: Secondary | ICD-10-CM | POA: Diagnosis not present

## 2015-12-31 DIAGNOSIS — M5408 Panniculitis affecting regions of neck and back, sacral and sacrococcygeal region: Secondary | ICD-10-CM | POA: Diagnosis not present

## 2015-12-31 DIAGNOSIS — M9901 Segmental and somatic dysfunction of cervical region: Secondary | ICD-10-CM | POA: Diagnosis not present

## 2015-12-31 DIAGNOSIS — M9902 Segmental and somatic dysfunction of thoracic region: Secondary | ICD-10-CM | POA: Diagnosis not present

## 2016-01-07 NOTE — Telephone Encounter (Signed)
Labs reviewed 12/01/15: Sodium 139, potassium 4.3, BUN 14, creatinine 0.75 AST 13, ALT 14 Total cholesterol 245, triglycerides 103, HDL 43, LDL 180

## 2016-01-09 NOTE — Telephone Encounter (Signed)
Advised patient of lab results and she would like to continue to work on diet She would like to recheck labs in 12 weeks

## 2016-01-09 NOTE — Addendum Note (Signed)
Addended by: Regis BillPRATT, Dakota Stangl B on: 01/09/2016 02:43 PM   Modules accepted: Orders

## 2016-01-09 NOTE — Addendum Note (Signed)
Addended by: Regis BillPRATT, Rilley Poulter B on: 01/09/2016 04:08 PM   Modules accepted: Orders

## 2016-01-23 DIAGNOSIS — G4733 Obstructive sleep apnea (adult) (pediatric): Secondary | ICD-10-CM | POA: Diagnosis not present

## 2016-01-28 DIAGNOSIS — M9902 Segmental and somatic dysfunction of thoracic region: Secondary | ICD-10-CM | POA: Diagnosis not present

## 2016-01-28 DIAGNOSIS — M9901 Segmental and somatic dysfunction of cervical region: Secondary | ICD-10-CM | POA: Diagnosis not present

## 2016-01-28 DIAGNOSIS — M5408 Panniculitis affecting regions of neck and back, sacral and sacrococcygeal region: Secondary | ICD-10-CM | POA: Diagnosis not present

## 2016-01-28 DIAGNOSIS — M9903 Segmental and somatic dysfunction of lumbar region: Secondary | ICD-10-CM | POA: Diagnosis not present

## 2016-03-03 DIAGNOSIS — M9903 Segmental and somatic dysfunction of lumbar region: Secondary | ICD-10-CM | POA: Diagnosis not present

## 2016-03-03 DIAGNOSIS — M9901 Segmental and somatic dysfunction of cervical region: Secondary | ICD-10-CM | POA: Diagnosis not present

## 2016-03-03 DIAGNOSIS — M9902 Segmental and somatic dysfunction of thoracic region: Secondary | ICD-10-CM | POA: Diagnosis not present

## 2016-03-03 DIAGNOSIS — M5408 Panniculitis affecting regions of neck and back, sacral and sacrococcygeal region: Secondary | ICD-10-CM | POA: Diagnosis not present

## 2016-03-05 DIAGNOSIS — F17210 Nicotine dependence, cigarettes, uncomplicated: Secondary | ICD-10-CM | POA: Diagnosis not present

## 2016-03-05 DIAGNOSIS — H9201 Otalgia, right ear: Secondary | ICD-10-CM | POA: Diagnosis not present

## 2016-03-05 DIAGNOSIS — H6121 Impacted cerumen, right ear: Secondary | ICD-10-CM | POA: Diagnosis not present

## 2016-03-05 DIAGNOSIS — J343 Hypertrophy of nasal turbinates: Secondary | ICD-10-CM | POA: Diagnosis not present

## 2016-03-31 DIAGNOSIS — M9902 Segmental and somatic dysfunction of thoracic region: Secondary | ICD-10-CM | POA: Diagnosis not present

## 2016-03-31 DIAGNOSIS — M5408 Panniculitis affecting regions of neck and back, sacral and sacrococcygeal region: Secondary | ICD-10-CM | POA: Diagnosis not present

## 2016-03-31 DIAGNOSIS — M9903 Segmental and somatic dysfunction of lumbar region: Secondary | ICD-10-CM | POA: Diagnosis not present

## 2016-03-31 DIAGNOSIS — M9901 Segmental and somatic dysfunction of cervical region: Secondary | ICD-10-CM | POA: Diagnosis not present

## 2016-05-19 DIAGNOSIS — M5408 Panniculitis affecting regions of neck and back, sacral and sacrococcygeal region: Secondary | ICD-10-CM | POA: Diagnosis not present

## 2016-05-19 DIAGNOSIS — M9903 Segmental and somatic dysfunction of lumbar region: Secondary | ICD-10-CM | POA: Diagnosis not present

## 2016-05-19 DIAGNOSIS — M9901 Segmental and somatic dysfunction of cervical region: Secondary | ICD-10-CM | POA: Diagnosis not present

## 2016-05-19 DIAGNOSIS — M9902 Segmental and somatic dysfunction of thoracic region: Secondary | ICD-10-CM | POA: Diagnosis not present

## 2016-06-23 DIAGNOSIS — M5408 Panniculitis affecting regions of neck and back, sacral and sacrococcygeal region: Secondary | ICD-10-CM | POA: Diagnosis not present

## 2016-06-23 DIAGNOSIS — M9903 Segmental and somatic dysfunction of lumbar region: Secondary | ICD-10-CM | POA: Diagnosis not present

## 2016-06-23 DIAGNOSIS — M9902 Segmental and somatic dysfunction of thoracic region: Secondary | ICD-10-CM | POA: Diagnosis not present

## 2016-06-23 DIAGNOSIS — M9901 Segmental and somatic dysfunction of cervical region: Secondary | ICD-10-CM | POA: Diagnosis not present

## 2016-07-21 DIAGNOSIS — M5408 Panniculitis affecting regions of neck and back, sacral and sacrococcygeal region: Secondary | ICD-10-CM | POA: Diagnosis not present

## 2016-07-21 DIAGNOSIS — M9901 Segmental and somatic dysfunction of cervical region: Secondary | ICD-10-CM | POA: Diagnosis not present

## 2016-07-21 DIAGNOSIS — M9902 Segmental and somatic dysfunction of thoracic region: Secondary | ICD-10-CM | POA: Diagnosis not present

## 2016-07-21 DIAGNOSIS — M9903 Segmental and somatic dysfunction of lumbar region: Secondary | ICD-10-CM | POA: Diagnosis not present

## 2016-08-11 DIAGNOSIS — M5408 Panniculitis affecting regions of neck and back, sacral and sacrococcygeal region: Secondary | ICD-10-CM | POA: Diagnosis not present

## 2016-08-11 DIAGNOSIS — M9902 Segmental and somatic dysfunction of thoracic region: Secondary | ICD-10-CM | POA: Diagnosis not present

## 2016-08-11 DIAGNOSIS — M9901 Segmental and somatic dysfunction of cervical region: Secondary | ICD-10-CM | POA: Diagnosis not present

## 2016-08-11 DIAGNOSIS — M9903 Segmental and somatic dysfunction of lumbar region: Secondary | ICD-10-CM | POA: Diagnosis not present

## 2016-09-08 DIAGNOSIS — M9903 Segmental and somatic dysfunction of lumbar region: Secondary | ICD-10-CM | POA: Diagnosis not present

## 2016-09-08 DIAGNOSIS — M5408 Panniculitis affecting regions of neck and back, sacral and sacrococcygeal region: Secondary | ICD-10-CM | POA: Diagnosis not present

## 2016-09-08 DIAGNOSIS — M9901 Segmental and somatic dysfunction of cervical region: Secondary | ICD-10-CM | POA: Diagnosis not present

## 2016-09-08 DIAGNOSIS — M9902 Segmental and somatic dysfunction of thoracic region: Secondary | ICD-10-CM | POA: Diagnosis not present

## 2016-09-10 ENCOUNTER — Telehealth: Payer: Self-pay | Admitting: Cardiovascular Disease

## 2016-09-10 NOTE — Telephone Encounter (Signed)
Spoke with patient and explained that labs could be done prior to visit however if Dr Duke Salvia wanting something besides LP/CMET at visit she would have to be stuck again. Patient opted to just come fasting for her appointment

## 2016-09-10 NOTE — Telephone Encounter (Signed)
OK thank you 

## 2016-09-10 NOTE — Telephone Encounter (Signed)
New message    Do you want her to have fasting blood work before her yearly appt?

## 2016-10-11 ENCOUNTER — Ambulatory Visit: Payer: BLUE CROSS/BLUE SHIELD | Admitting: Cardiovascular Disease

## 2016-10-20 DIAGNOSIS — M9903 Segmental and somatic dysfunction of lumbar region: Secondary | ICD-10-CM | POA: Diagnosis not present

## 2016-10-20 DIAGNOSIS — M5417 Radiculopathy, lumbosacral region: Secondary | ICD-10-CM | POA: Diagnosis not present

## 2016-10-20 DIAGNOSIS — M9902 Segmental and somatic dysfunction of thoracic region: Secondary | ICD-10-CM | POA: Diagnosis not present

## 2016-10-20 DIAGNOSIS — M9904 Segmental and somatic dysfunction of sacral region: Secondary | ICD-10-CM | POA: Diagnosis not present

## 2016-11-03 DIAGNOSIS — M9902 Segmental and somatic dysfunction of thoracic region: Secondary | ICD-10-CM | POA: Diagnosis not present

## 2016-11-03 DIAGNOSIS — M9903 Segmental and somatic dysfunction of lumbar region: Secondary | ICD-10-CM | POA: Diagnosis not present

## 2016-11-03 DIAGNOSIS — M9904 Segmental and somatic dysfunction of sacral region: Secondary | ICD-10-CM | POA: Diagnosis not present

## 2016-11-03 DIAGNOSIS — M5417 Radiculopathy, lumbosacral region: Secondary | ICD-10-CM | POA: Diagnosis not present

## 2016-11-28 NOTE — Progress Notes (Signed)
Cardiology Office Note   Date:  11/29/2016   ID:  Thana Ates, DOB [birth year], MRN [id number]  PCP:  Rosario, No Pcp Per  Cardiologist:   Chilton Si, MD   No chief complaint on file.   History of Present Illness: Barbara Rosario is a 51 y.o. female with hyperlipidemia and PVCs who presents for follow up. Barbara Rosario was first seen 08/2014 with palpitations that had been ongoing for 15 years.  She saw her PCP and was started on proranolol, which helped Barbara symptoms.  She work a 48 hour Holter 08/2014 that showed frequent PVCs (52 in 48 hours).  She had a sleep study that revealed moderate sleep apnea.  She was started on a CPAP by Dr. Tresa Endo.    Since her last appointment Barbara Rosario had lipids checked and her LDL was 180.  She preferred to work on diet and exercise rather than start a statin.  She has been feeling well and denies chest pain or shortness of breath.  She had one day of palpitations last week but overall they have been well-controlled.  She has not needed to take any propranolol lately.  She has been working on her diet and done well overall.  However she notes that she is struggling more lately.  She did Barbara keto diet and significantly reduced carb intake.  She thinks that her palpitations were actually related to carbohydrates.  She exercises by doing yoga for approximately 30 minutes 3 or 4 times per week.  She feels good with exercise.  She has been cutting back on her smoking.  She currently smokes approximately 1 pack of cigarettes per week.  Both she and her partner are working to quit smoking.  She has been using a vape instead.  Her blood pressure at home ranges from Barbara 110s to Barbara 130s over 70s-80s.  Past Medical History:  Diagnosis Date  . Essential hypertension 01/27/2015  . Genital warts   . Hyperlipemia   . Lower extremity edema 09/02/2014  . Palpitations 09/02/2014  . PVC's (premature ventricular contractions) 08/18/2015  . Tobacco abuse 08/18/2015  .  Tobacco dependence     Past Surgical History:  Procedure Laterality Date  . DILATION AND CURETTAGE OF UTERUS    . mastiotympanostomy    . nasal polyps       Current Outpatient Medications  Medication Sig Dispense Refill  . propranolol (INDERAL) 20 MG/5ML solution Take daily as needed by mouth (palpitations).    . triamterene-hydrochlorothiazide (MAXZIDE-25) 37.5-25 MG tablet Take 1 tablet as needed by mouth (swelling).     No current facility-administered medications for this visit.     Allergies:   Codeine and Sulfa antibiotics    Social History:  Barbara Rosario  reports that she has been smoking.  she has never used smokeless tobacco. She reports that she does not drink alcohol or use drugs.   Family History:  Barbara Rosario's  family history includes Cancer in her father; Heart attack in her paternal grandfather; Heart disease in her paternal grandfather.    ROS:  Please see Barbara history of present illness.   Otherwise, review of systems are positive for none.   All other systems are reviewed and negative.    PHYSICAL EXAM: VS:  BP 122/78   Pulse 88   Ht 5\' 5"  (1.651 m)   Wt 99.6 kg (219 lb 9.6 oz)   BMI 36.54 kg/m  , BMI Body mass index is 36.54 kg/m. GENERAL:  Well  appearing HEENT: Pupils equal round and reactive, fundi not visualized, oral mucosa unremarkable NECK:  No jugular venous distention, waveform within normal limits, carotid upstroke brisk and symmetric, no bruits, no thyromegaly LUNGS:  Clear to auscultation bilaterally.  No crackles, wheezes or rhonchi.  HEART:  RRR.  PMI not displaced or sustained,S1 and S2 within normal limits, no S3, no S4, no clicks, no rubs, no murmurs ABD:  Flat, positive bowel sounds normal in frequency in pitch, no bruits, no rebound, no guarding, no midline pulsatile mass, no hepatomegaly, no splenomegaly EXT:  2 plus pulses throughout, no edema, no cyanosis no clubbing SKIN:  No rashes no nodules NEURO:  Cranial nerves II through XII  grossly intact, motor grossly intact throughout PSYCH:  Cognitively intact, oriented to person place and time    EKG:  EKG is ordered today. Barbara ekg ordered 08/18/15 demonstrates sinus rhythm rate 92 bpm. 11/29/16: sinus rhythm.  Rate 88 bpm.    Echo 09/16/14: Study Conclusions  - Left ventricle: Barbara cavity size was normal. Systolic function was normal. Barbara estimated ejection fraction was in Barbara range of 50% to 55%.  48 Hour Holter Monitor 09/25/14   Quality: Fair. Baseline artifact. Predominant rhythm: sinus Average heart rate: 88 bpm Max heart rate: 117 bpm Min heart rate: 63 bpm Pauses >2.5 seconds: 0 Ventricular ectopics: 52 (48 isolated, 0 bigemeny, 2 couplets)  Percentage of ectopic beats: 0.02% Morphology: monomorphic Supraventricular ectopics: 5 Rosario did not submit a symptom diary  Recent Labs: 12/01/2015: ALT 14; BUN 14; Creat 0.75; Potassium 4.3; Sodium 139    Lipid Panel    Component Value Date/Time   CHOL 245 (H) 12/01/2015 0842   TRIG 103 12/01/2015 0842   HDL 43 (L) 12/01/2015 0842   CHOLHDL 5.7 (H) 12/01/2015 0842   CHOLHDL 5.5 (H) 08/18/2015 0859   VLDL 23 08/18/2015 0859   LDLCALC 180 (H) 12/01/2015 0842       Wt Readings from Last 3 Encounters:  11/29/16 99.6 kg (219 lb 9.6 oz)  08/18/15 104.8 kg (231 lb)  01/24/15 99.8 kg (220 lb)      Other studies Reviewed: Additional studies/ records that were reviewed today include: medical record. Review of Barbara above records demonstrates:  Please see elsewhere in Barbara note.     ASSESSMENT AND PLAN:  # Hyperlipidemia: Barbara Rosario has a history of hyperlipidemia.  Her LDL was last 180 11/2015.  She has been resistant to taking medications and prefers natural treatments, diet, and exercise.  We will repeat a fasting lipid panel and CMP today.  We will also get a coronary calcium score to better risk stratify.   # Obesity: Check hemoglobin A1c today.  She will also continue to work on diet and  exercise.  # Palpitations: Better-controlled. Continue propranolol as needed.   # Hypertension: Blood pressure well-controlled off all medications. As is likely due to weight loss. She was encouraged to keep working on this.   # OSA: Continue CPAP as ordered by Dr. Tresa Endo.  # Tobacco abuse: Barbara Rosario was congratulated on cutting back her smoking.  She was encouraged to quit completely.  She understands that Barbara vape is not a long-term solution.    Current medicines are reviewed at length with Barbara Rosario today.  Barbara Rosario does not have concerns regarding medicines.  Barbara following changes have been made:  no change  Labs/ tests ordered today include: none  Orders Placed This Encounter  Procedures  . CT CARDIAC SCORING  .  Lipid panel  . CBC with Differential/Platelet  . Comprehensive metabolic panel  . HgB A1c  . EKG 12-Lead     Disposition:   FU with Dr. Elmarie Shileyiffany C. Duke Salviaandolph in 1 year   Signed, Chilton Siiffany Elim, MD  11/29/2016 1:29 PM    [facility]

## 2016-11-29 ENCOUNTER — Encounter: Payer: Self-pay | Admitting: Cardiovascular Disease

## 2016-11-29 ENCOUNTER — Ambulatory Visit: Payer: BLUE CROSS/BLUE SHIELD | Admitting: Cardiovascular Disease

## 2016-11-29 VITALS — BP 122/78 | HR 88 | Ht 65.0 in | Wt 219.6 lb

## 2016-11-29 DIAGNOSIS — E785 Hyperlipidemia, unspecified: Secondary | ICD-10-CM

## 2016-11-29 DIAGNOSIS — E669 Obesity, unspecified: Secondary | ICD-10-CM

## 2016-11-29 DIAGNOSIS — I10 Essential (primary) hypertension: Secondary | ICD-10-CM

## 2016-11-29 DIAGNOSIS — Z6836 Body mass index (BMI) 36.0-36.9, adult: Secondary | ICD-10-CM

## 2016-11-29 DIAGNOSIS — Z720 Tobacco use: Secondary | ICD-10-CM

## 2016-11-29 NOTE — Patient Instructions (Signed)
Medication Instructions:  Your physician recommends that you continue on your current medications as directed. Please refer to the Current Medication list given to you today.  Labwork: FASTING LP/CMET/CBC/HGB A1C SOON  Testing/Procedures: CALCIUM SCORE  WILL COST $150 OUT OF POCKET  Follow-Up: Your physician wants you to follow-up in: 1 YEAR OV  You will receive a reminder letter in the mail two months in advance. If you don't receive a letter, please call our office to schedule the follow-up appointment.  If you need a refill on your cardiac medications before your next appointment, please call your pharmacy.

## 2016-11-30 LAB — COMPREHENSIVE METABOLIC PANEL
ALBUMIN: 4.9 g/dL (ref 3.5–5.5)
ALK PHOS: 73 IU/L (ref 39–117)
ALT: 15 IU/L (ref 0–32)
AST: 13 IU/L (ref 0–40)
Albumin/Globulin Ratio: 2 (ref 1.2–2.2)
BUN/Creatinine Ratio: 19 (ref 9–23)
BUN: 13 mg/dL (ref 6–24)
Bilirubin Total: 0.3 mg/dL (ref 0.0–1.2)
CHLORIDE: 103 mmol/L (ref 96–106)
CO2: 23 mmol/L (ref 20–29)
Calcium: 9.9 mg/dL (ref 8.7–10.2)
Creatinine, Ser: 0.7 mg/dL (ref 0.57–1.00)
GFR calc Af Amer: 117 mL/min/{1.73_m2} (ref >59)
GFR, EST NON AFRICAN AMERICAN: 101 mL/min/{1.73_m2} (ref >59)
GLOBULIN, TOTAL: 2.5 g/dL (ref 1.5–4.5)
Glucose: 98 mg/dL (ref 65–99)
Potassium: 4.7 mmol/L (ref 3.5–5.2)
Sodium: 142 mmol/L (ref 134–144)
Total Protein: 7.4 g/dL (ref 6.0–8.5)

## 2016-11-30 LAB — CBC WITH DIFFERENTIAL/PLATELET
BASOS ABS: 0 10*3/uL (ref 0.0–0.2)
Basos: 0 %
EOS (ABSOLUTE): 0.1 10*3/uL (ref 0.0–0.4)
EOS: 1 %
HEMATOCRIT: 43.9 % (ref 34.0–46.6)
HEMOGLOBIN: 15.2 g/dL (ref 11.1–15.9)
Immature Grans (Abs): 0 10*3/uL (ref 0.0–0.1)
Immature Granulocytes: 0 %
Lymphocytes Absolute: 2.9 10*3/uL (ref 0.7–3.1)
Lymphs: 39 %
MCH: 30.8 pg (ref 26.6–33.0)
MCHC: 34.6 g/dL (ref 31.5–35.7)
MCV: 89 fL (ref 79–97)
MONOCYTES: 7 %
Monocytes Absolute: 0.5 10*3/uL (ref 0.1–0.9)
NEUTROS PCT: 53 %
Neutrophils Absolute: 3.8 10*3/uL (ref 1.4–7.0)
Platelets: 287 10*3/uL (ref 150–379)
RBC: 4.93 x10E6/uL (ref 3.77–5.28)
RDW: 14.3 % (ref 12.3–15.4)
WBC: 7.3 10*3/uL (ref 3.4–10.8)

## 2016-11-30 LAB — HEMOGLOBIN A1C
Est. average glucose Bld gHb Est-mCnc: 111 mg/dL
Hgb A1c MFr Bld: 5.5 % (ref 4.8–5.6)

## 2016-11-30 LAB — LIPID PANEL
Chol/HDL Ratio: 5.3 ratio — ABNORMAL HIGH (ref 0.0–4.4)
Cholesterol, Total: 290 mg/dL — ABNORMAL HIGH (ref 100–199)
HDL: 55 mg/dL (ref >39)
LDL Calculated: 204 mg/dL — ABNORMAL HIGH (ref 0–99)
Triglycerides: 155 mg/dL — ABNORMAL HIGH (ref 0–149)
VLDL Cholesterol Cal: 31 mg/dL (ref 5–40)

## 2016-12-01 DIAGNOSIS — M5417 Radiculopathy, lumbosacral region: Secondary | ICD-10-CM | POA: Diagnosis not present

## 2016-12-01 DIAGNOSIS — M9904 Segmental and somatic dysfunction of sacral region: Secondary | ICD-10-CM | POA: Diagnosis not present

## 2016-12-01 DIAGNOSIS — M9902 Segmental and somatic dysfunction of thoracic region: Secondary | ICD-10-CM | POA: Diagnosis not present

## 2016-12-01 DIAGNOSIS — M9903 Segmental and somatic dysfunction of lumbar region: Secondary | ICD-10-CM | POA: Diagnosis not present

## 2016-12-04 DIAGNOSIS — L0390 Cellulitis, unspecified: Secondary | ICD-10-CM | POA: Diagnosis not present

## 2016-12-06 ENCOUNTER — Inpatient Hospital Stay: Admission: RE | Admit: 2016-12-06 | Payer: BLUE CROSS/BLUE SHIELD | Source: Ambulatory Visit

## 2016-12-09 ENCOUNTER — Encounter: Payer: Self-pay | Admitting: Radiology

## 2016-12-09 ENCOUNTER — Ambulatory Visit (INDEPENDENT_AMBULATORY_CARE_PROVIDER_SITE_OTHER)
Admission: RE | Admit: 2016-12-09 | Discharge: 2016-12-09 | Disposition: A | Discharge status: Home / Self Care | Payer: Self-pay | Source: Ambulatory Visit | Attending: Cardiovascular Disease | Admitting: Cardiovascular Disease

## 2016-12-09 DIAGNOSIS — Z720 Tobacco use: Secondary | ICD-10-CM

## 2016-12-13 ENCOUNTER — Other Ambulatory Visit: Payer: BLUE CROSS/BLUE SHIELD

## 2016-12-13 DIAGNOSIS — M5417 Radiculopathy, lumbosacral region: Secondary | ICD-10-CM | POA: Diagnosis not present

## 2016-12-13 DIAGNOSIS — M9904 Segmental and somatic dysfunction of sacral region: Secondary | ICD-10-CM | POA: Diagnosis not present

## 2016-12-13 DIAGNOSIS — M9902 Segmental and somatic dysfunction of thoracic region: Secondary | ICD-10-CM | POA: Diagnosis not present

## 2016-12-13 DIAGNOSIS — M9903 Segmental and somatic dysfunction of lumbar region: Secondary | ICD-10-CM | POA: Diagnosis not present

## 2016-12-29 DIAGNOSIS — M9904 Segmental and somatic dysfunction of sacral region: Secondary | ICD-10-CM | POA: Diagnosis not present

## 2016-12-29 DIAGNOSIS — M9903 Segmental and somatic dysfunction of lumbar region: Secondary | ICD-10-CM | POA: Diagnosis not present

## 2016-12-29 DIAGNOSIS — M9902 Segmental and somatic dysfunction of thoracic region: Secondary | ICD-10-CM | POA: Diagnosis not present

## 2016-12-29 DIAGNOSIS — M5417 Radiculopathy, lumbosacral region: Secondary | ICD-10-CM | POA: Diagnosis not present

## 2016-12-30 DIAGNOSIS — N764 Abscess of vulva: Secondary | ICD-10-CM | POA: Diagnosis not present

## 2017-01-19 DIAGNOSIS — M9903 Segmental and somatic dysfunction of lumbar region: Secondary | ICD-10-CM | POA: Diagnosis not present

## 2017-01-19 DIAGNOSIS — M5417 Radiculopathy, lumbosacral region: Secondary | ICD-10-CM | POA: Diagnosis not present

## 2017-01-19 DIAGNOSIS — M9902 Segmental and somatic dysfunction of thoracic region: Secondary | ICD-10-CM | POA: Diagnosis not present

## 2017-01-19 DIAGNOSIS — M9904 Segmental and somatic dysfunction of sacral region: Secondary | ICD-10-CM | POA: Diagnosis not present

## 2017-01-20 DIAGNOSIS — N764 Abscess of vulva: Secondary | ICD-10-CM | POA: Diagnosis not present

## 2017-02-23 DIAGNOSIS — M9902 Segmental and somatic dysfunction of thoracic region: Secondary | ICD-10-CM | POA: Diagnosis not present

## 2017-02-23 DIAGNOSIS — M5417 Radiculopathy, lumbosacral region: Secondary | ICD-10-CM | POA: Diagnosis not present

## 2017-03-07 DIAGNOSIS — N393 Stress incontinence (female) (male): Secondary | ICD-10-CM | POA: Diagnosis not present

## 2017-03-07 DIAGNOSIS — N764 Abscess of vulva: Secondary | ICD-10-CM | POA: Diagnosis not present

## 2017-03-07 DIAGNOSIS — H05229 Edema of unspecified orbit: Secondary | ICD-10-CM | POA: Diagnosis not present

## 2017-03-07 DIAGNOSIS — R2231 Localized swelling, mass and lump, right upper limb: Secondary | ICD-10-CM | POA: Diagnosis not present

## 2017-03-07 DIAGNOSIS — N3946 Mixed incontinence: Secondary | ICD-10-CM | POA: Diagnosis not present

## 2017-03-07 DIAGNOSIS — E785 Hyperlipidemia, unspecified: Secondary | ICD-10-CM | POA: Diagnosis not present

## 2017-03-07 DIAGNOSIS — E559 Vitamin D deficiency, unspecified: Secondary | ICD-10-CM | POA: Diagnosis not present

## 2017-03-07 DIAGNOSIS — H9201 Otalgia, right ear: Secondary | ICD-10-CM | POA: Diagnosis not present

## 2017-03-18 ENCOUNTER — Encounter (HOSPITAL_BASED_OUTPATIENT_CLINIC_OR_DEPARTMENT_OTHER): Payer: Self-pay

## 2017-03-18 ENCOUNTER — Ambulatory Visit (HOSPITAL_BASED_OUTPATIENT_CLINIC_OR_DEPARTMENT_OTHER): Admit: 2017-03-18 | Payer: BLUE CROSS/BLUE SHIELD | Admitting: Obstetrics and Gynecology

## 2017-03-18 SURGERY — EXCISION, LESION, VAGINA
Anesthesia: Choice

## 2017-03-23 DIAGNOSIS — M5417 Radiculopathy, lumbosacral region: Secondary | ICD-10-CM | POA: Diagnosis not present

## 2017-03-23 DIAGNOSIS — M9905 Segmental and somatic dysfunction of pelvic region: Secondary | ICD-10-CM | POA: Diagnosis not present

## 2017-03-23 DIAGNOSIS — M9902 Segmental and somatic dysfunction of thoracic region: Secondary | ICD-10-CM | POA: Diagnosis not present

## 2017-03-23 DIAGNOSIS — M9903 Segmental and somatic dysfunction of lumbar region: Secondary | ICD-10-CM | POA: Diagnosis not present

## 2017-04-07 ENCOUNTER — Other Ambulatory Visit: Payer: Self-pay | Admitting: Internal Medicine

## 2017-04-07 DIAGNOSIS — Z1231 Encounter for screening mammogram for malignant neoplasm of breast: Secondary | ICD-10-CM

## 2017-04-18 DIAGNOSIS — Z1211 Encounter for screening for malignant neoplasm of colon: Secondary | ICD-10-CM | POA: Diagnosis not present

## 2017-04-20 DIAGNOSIS — M9905 Segmental and somatic dysfunction of pelvic region: Secondary | ICD-10-CM | POA: Diagnosis not present

## 2017-04-20 DIAGNOSIS — M9902 Segmental and somatic dysfunction of thoracic region: Secondary | ICD-10-CM | POA: Diagnosis not present

## 2017-04-20 DIAGNOSIS — M5417 Radiculopathy, lumbosacral region: Secondary | ICD-10-CM | POA: Diagnosis not present

## 2017-04-20 DIAGNOSIS — M9903 Segmental and somatic dysfunction of lumbar region: Secondary | ICD-10-CM | POA: Diagnosis not present

## 2017-04-25 ENCOUNTER — Ambulatory Visit: Payer: BLUE CROSS/BLUE SHIELD

## 2017-05-02 DIAGNOSIS — N3946 Mixed incontinence: Secondary | ICD-10-CM | POA: Diagnosis not present

## 2017-05-11 DIAGNOSIS — M9905 Segmental and somatic dysfunction of pelvic region: Secondary | ICD-10-CM | POA: Diagnosis not present

## 2017-05-11 DIAGNOSIS — M9903 Segmental and somatic dysfunction of lumbar region: Secondary | ICD-10-CM | POA: Diagnosis not present

## 2017-05-11 DIAGNOSIS — M5417 Radiculopathy, lumbosacral region: Secondary | ICD-10-CM | POA: Diagnosis not present

## 2017-05-11 DIAGNOSIS — M9902 Segmental and somatic dysfunction of thoracic region: Secondary | ICD-10-CM | POA: Diagnosis not present

## 2017-05-30 DIAGNOSIS — M6281 Muscle weakness (generalized): Secondary | ICD-10-CM | POA: Diagnosis not present

## 2017-05-30 DIAGNOSIS — R350 Frequency of micturition: Secondary | ICD-10-CM | POA: Diagnosis not present

## 2017-05-30 DIAGNOSIS — N3941 Urge incontinence: Secondary | ICD-10-CM | POA: Diagnosis not present

## 2017-06-13 ENCOUNTER — Ambulatory Visit: Payer: BLUE CROSS/BLUE SHIELD

## 2017-06-16 DIAGNOSIS — M5417 Radiculopathy, lumbosacral region: Secondary | ICD-10-CM | POA: Diagnosis not present

## 2017-06-16 DIAGNOSIS — M9902 Segmental and somatic dysfunction of thoracic region: Secondary | ICD-10-CM | POA: Diagnosis not present

## 2017-06-16 DIAGNOSIS — M9903 Segmental and somatic dysfunction of lumbar region: Secondary | ICD-10-CM | POA: Diagnosis not present

## 2017-06-16 DIAGNOSIS — M9905 Segmental and somatic dysfunction of pelvic region: Secondary | ICD-10-CM | POA: Diagnosis not present

## 2017-06-27 DIAGNOSIS — R350 Frequency of micturition: Secondary | ICD-10-CM | POA: Diagnosis not present

## 2017-06-27 DIAGNOSIS — M6281 Muscle weakness (generalized): Secondary | ICD-10-CM | POA: Diagnosis not present

## 2017-06-27 DIAGNOSIS — N3941 Urge incontinence: Secondary | ICD-10-CM | POA: Diagnosis not present

## 2017-07-13 DIAGNOSIS — M5417 Radiculopathy, lumbosacral region: Secondary | ICD-10-CM | POA: Diagnosis not present

## 2017-07-13 DIAGNOSIS — M9903 Segmental and somatic dysfunction of lumbar region: Secondary | ICD-10-CM | POA: Diagnosis not present

## 2017-07-13 DIAGNOSIS — M9902 Segmental and somatic dysfunction of thoracic region: Secondary | ICD-10-CM | POA: Diagnosis not present

## 2017-07-13 DIAGNOSIS — M9905 Segmental and somatic dysfunction of pelvic region: Secondary | ICD-10-CM | POA: Diagnosis not present

## 2017-08-10 DIAGNOSIS — M9905 Segmental and somatic dysfunction of pelvic region: Secondary | ICD-10-CM | POA: Diagnosis not present

## 2017-08-10 DIAGNOSIS — M5417 Radiculopathy, lumbosacral region: Secondary | ICD-10-CM | POA: Diagnosis not present

## 2017-08-10 DIAGNOSIS — M9903 Segmental and somatic dysfunction of lumbar region: Secondary | ICD-10-CM | POA: Diagnosis not present

## 2017-09-07 DIAGNOSIS — M9902 Segmental and somatic dysfunction of thoracic region: Secondary | ICD-10-CM | POA: Diagnosis not present

## 2017-09-07 DIAGNOSIS — M5417 Radiculopathy, lumbosacral region: Secondary | ICD-10-CM | POA: Diagnosis not present

## 2017-09-07 DIAGNOSIS — M9905 Segmental and somatic dysfunction of pelvic region: Secondary | ICD-10-CM | POA: Diagnosis not present

## 2017-10-12 DIAGNOSIS — M9905 Segmental and somatic dysfunction of pelvic region: Secondary | ICD-10-CM | POA: Diagnosis not present

## 2017-10-12 DIAGNOSIS — M9902 Segmental and somatic dysfunction of thoracic region: Secondary | ICD-10-CM | POA: Diagnosis not present

## 2017-10-12 DIAGNOSIS — M5417 Radiculopathy, lumbosacral region: Secondary | ICD-10-CM | POA: Diagnosis not present

## 2017-10-18 DIAGNOSIS — S91331A Puncture wound without foreign body, right foot, initial encounter: Secondary | ICD-10-CM | POA: Diagnosis not present

## 2017-11-09 DIAGNOSIS — M5417 Radiculopathy, lumbosacral region: Secondary | ICD-10-CM | POA: Diagnosis not present

## 2017-11-09 DIAGNOSIS — M9903 Segmental and somatic dysfunction of lumbar region: Secondary | ICD-10-CM | POA: Diagnosis not present

## 2017-11-09 DIAGNOSIS — M9902 Segmental and somatic dysfunction of thoracic region: Secondary | ICD-10-CM | POA: Diagnosis not present

## 2017-11-09 DIAGNOSIS — M9905 Segmental and somatic dysfunction of pelvic region: Secondary | ICD-10-CM | POA: Diagnosis not present

## 2017-11-14 ENCOUNTER — Encounter: Payer: Self-pay | Admitting: Nurse Practitioner

## 2017-11-14 ENCOUNTER — Ambulatory Visit: Payer: BLUE CROSS/BLUE SHIELD | Admitting: Nurse Practitioner

## 2017-11-14 VITALS — BP 138/80 | HR 91 | Temp 98.6°F | Ht 64.0 in | Wt 234.0 lb

## 2017-11-14 DIAGNOSIS — Z0000 Encounter for general adult medical examination without abnormal findings: Secondary | ICD-10-CM

## 2017-11-14 DIAGNOSIS — N393 Stress incontinence (female) (male): Secondary | ICD-10-CM | POA: Diagnosis not present

## 2017-11-14 DIAGNOSIS — R601 Generalized edema: Secondary | ICD-10-CM | POA: Diagnosis not present

## 2017-11-14 DIAGNOSIS — Z720 Tobacco use: Secondary | ICD-10-CM | POA: Diagnosis not present

## 2017-11-14 DIAGNOSIS — Z139 Encounter for screening, unspecified: Secondary | ICD-10-CM

## 2017-11-14 LAB — POCT URINALYSIS DIPSTICK
BILIRUBIN UA: NEGATIVE
Glucose, UA: NEGATIVE
KETONES UA: NEGATIVE
Leukocytes, UA: NEGATIVE
Nitrite, UA: NEGATIVE
PH UA: 7 (ref 5.0–8.0)
Protein, UA: NEGATIVE
Spec Grav, UA: 1.02 (ref 1.010–1.025)
UROBILINOGEN UA: 0.2 U/dL

## 2017-11-14 NOTE — Patient Instructions (Addendum)
Colonoscopy is recommended however you would like to have cologuard done instead.  [employer] [facility] will call you to discuss the process.    Health Maintenance, Female Adopting a healthy lifestyle and getting preventive care can go a long way to promote health and wellness. Talk with your health care provider about what schedule of regular examinations is right for you. This is a good chance for you to check in with your provider about disease prevention and staying healthy. In between checkups, there are plenty of things you can do on your own. Experts have done a lot of research about which lifestyle changes and preventive measures are most likely to keep you healthy. Ask your health care provider for more information. Weight and diet Eat a healthy diet  Be sure to include plenty of vegetables, fruits, low-fat dairy products, and lean protein.  Do not eat a lot of foods high in solid fats, added sugars, or salt.  Get regular exercise. This is one of the most important things you can do for your health. ? Most adults should exercise for at least 150 minutes each week. The exercise should increase your heart rate and make you sweat (moderate-intensity exercise). ? Most adults should also do strengthening exercises at least twice a week. This is in addition to the moderate-intensity exercise.  Maintain a healthy weight  Body mass index (BMI) is a measurement that can be used to identify possible weight problems. It estimates body fat based on height and weight. Your health care provider can help determine your BMI and help you achieve or maintain a healthy weight.  For females 33 years of age and older: ? A BMI below 18.5 is considered underweight. ? A BMI of 18.5 to 24.9 is normal. ? A BMI of 25 to 29.9 is considered overweight. ? A BMI of 30 and above is considered obese.  Watch levels of cholesterol and blood lipids  You should start having your blood tested for lipids and cholesterol  at 51 years of age, then have this test every 5 years.  You may need to have your cholesterol levels checked more often if: ? Your lipid or cholesterol levels are high. ? You are older than 51 years of age. ? You are at high risk for heart disease.  Cancer screening Lung Cancer  Lung cancer screening is recommended for adults 71-83 years old who are at high risk for lung cancer because of a history of smoking.  A yearly low-dose CT scan of the lungs is recommended for people who: ? Currently smoke. ? Have quit within the past 15 years. ? Have at least a 30-pack-year history of smoking. A pack year is smoking an average of one pack of cigarettes a day for 1 year.  Yearly screening should continue until it has been 15 years since you quit.  Yearly screening should stop if you develop a health problem that would prevent you from having lung cancer treatment.  Breast Cancer  Practice breast self-awareness. This means understanding how your breasts normally appear and feel.  It also means doing regular breast self-exams. Let your health care provider know about any changes, no matter how small.  If you are in your 20s or 30s, you should have a clinical breast exam (CBE) by a health care provider every 1-3 years as part of a regular health exam.  If you are 22 or older, have a CBE every year. Also consider having a breast X-ray (mammogram) every year.  If you have a family history of breast cancer, talk to your health care provider about genetic screening.  If you are at high risk for breast cancer, talk to your health care provider about having an MRI and a mammogram every year.  Breast cancer gene (BRCA) assessment is recommended for women who have family members with BRCA-related cancers. BRCA-related cancers include: ? Breast. ? Ovarian. ? Tubal. ? Peritoneal cancers.  Results of the assessment will determine the need for genetic counseling and BRCA1 and BRCA2  testing.  Cervical Cancer Your health care provider may recommend that you be screened regularly for cancer of the pelvic organs (ovaries, uterus, and vagina). This screening involves a pelvic examination, including checking for microscopic changes to the surface of your cervix (Pap test). You may be encouraged to have this screening done every 3 years, beginning at age 21.  For women ages 30-65, health care providers may recommend pelvic exams and Pap testing every 3 years, or they may recommend the Pap and pelvic exam, combined with testing for human papilloma virus (HPV), every 5 years. Some types of HPV increase your risk of cervical cancer. Testing for HPV may also be done on women of any age with unclear Pap test results.  Other health care providers may not recommend any screening for nonpregnant women who are considered low risk for pelvic cancer and who do not have symptoms. Ask your health care provider if a screening pelvic exam is right for you.  If you have had past treatment for cervical cancer or a condition that could lead to cancer, you need Pap tests and screening for cancer for at least 20 years after your treatment. If Pap tests have been discontinued, your risk factors (such as having a new sexual partner) need to be reassessed to determine if screening should resume. Some women have medical problems that increase the chance of getting cervical cancer. In these cases, your health care provider may recommend more frequent screening and Pap tests.  Colorectal Cancer  This type of cancer can be detected and often prevented.  Routine colorectal cancer screening usually begins at 50 years of age and continues through 51 years of age.  Your health care provider may recommend screening at an earlier age if you have risk factors for colon cancer.  Your health care provider may also recommend using home test kits to check for hidden blood in the stool.  A small camera at the end of a  tube can be used to examine your colon directly (sigmoidoscopy or colonoscopy). This is done to check for the earliest forms of colorectal cancer.  Routine screening usually begins at age 50.  Direct examination of the colon should be repeated every 5-10 years through 51 years of age. However, you may need to be screened more often if early forms of precancerous polyps or small growths are found.  Skin Cancer  Check your skin from head to toe regularly.  Tell your health care provider about any new moles or changes in moles, especially if there is a change in a mole's shape or color.  Also tell your health care provider if you have a mole that is larger than the size of a pencil eraser.  Always use sunscreen. Apply sunscreen liberally and repeatedly throughout the day.  Protect yourself by wearing long sleeves, pants, a wide-brimmed hat, and sunglasses whenever you are outside.  Heart disease, diabetes, and high blood pressure  High blood pressure causes heart disease and   increases the risk of stroke. High blood pressure is more likely to develop in: ? People who have blood pressure in the high end of the normal range (130-139/85-89 mm Hg). ? People who are overweight or obese. ? People who are African American.  If you are 18-39 years of age, have your blood pressure checked every 3-5 years. If you are 40 years of age or older, have your blood pressure checked every year. You should have your blood pressure measured twice-once when you are at a hospital or clinic, and once when you are not at a hospital or clinic. Record the average of the two measurements. To check your blood pressure when you are not at a hospital or clinic, you can use: ? An automated blood pressure machine at a pharmacy. ? A home blood pressure monitor.  If you are between 55 years and 79 years old, ask your health care provider if you should take aspirin to prevent strokes.  Have regular diabetes screenings. This  involves taking a blood sample to check your fasting blood sugar level. ? If you are at a normal weight and have a low risk for diabetes, have this test once every three years after 51 years of age. ? If you are overweight and have a high risk for diabetes, consider being tested at a younger age or more often. Preventing infection Hepatitis B  If you have a higher risk for hepatitis B, you should be screened for this virus. You are considered at high risk for hepatitis B if: ? You were born in a country where hepatitis B is common. Ask your health care provider which countries are considered high risk. ? Your parents were born in a high-risk country, and you have not been immunized against hepatitis B (hepatitis B vaccine). ? You have HIV or AIDS. ? You use needles to inject street drugs. ? You live with someone who has hepatitis B. ? You have had sex with someone who has hepatitis B. ? You get hemodialysis treatment. ? You take certain medicines for conditions, including cancer, organ transplantation, and autoimmune conditions.  Hepatitis C  Blood testing is recommended for: ? Everyone born from 1945 through 1965. ? Anyone with known risk factors for hepatitis C.  Sexually transmitted infections (STIs)  You should be screened for sexually transmitted infections (STIs) including gonorrhea and chlamydia if: ? You are sexually active and are younger than 51 years of age. ? You are older than 51 years of age and your health care provider tells you that you are at risk for this type of infection. ? Your sexual activity has changed since you were last screened and you are at an increased risk for chlamydia or gonorrhea. Ask your health care provider if you are at risk.  If you do not have HIV, but are at risk, it may be recommended that you take a prescription medicine daily to prevent HIV infection. This is called pre-exposure prophylaxis (PrEP). You are considered at risk if: ? You are  sexually active and do not regularly use condoms or know the HIV status of your partner(s). ? You take drugs by injection. ? You are sexually active with a partner who has HIV.  Talk with your health care provider about whether you are at high risk of being infected with HIV. If you choose to begin PrEP, you should first be tested for HIV. You should then be tested every 3 months for as long as you are taking   PrEP. Pregnancy  If you are premenopausal and you may become pregnant, ask your health care provider about preconception counseling.  If you may become pregnant, take 400 to 800 micrograms (mcg) of folic acid every day.  If you want to prevent pregnancy, talk to your health care provider about birth control (contraception). Osteoporosis and menopause  Osteoporosis is a disease in which the bones lose minerals and strength with aging. This can result in serious bone fractures. Your risk for osteoporosis can be identified using a bone density scan.  If you are 104 years of age or older, or if you are at risk for osteoporosis and fractures, ask your health care provider if you should be screened.  Ask your health care provider whether you should take a calcium or vitamin D supplement to lower your risk for osteoporosis.  Menopause may have certain physical symptoms and risks.  Hormone replacement therapy may reduce some of these symptoms and risks. Talk to your health care provider about whether hormone replacement therapy is right for you. Follow these instructions at home:  Schedule regular health, dental, and eye exams.  Stay current with your immunizations.  Do not use any tobacco products including cigarettes, chewing tobacco, or electronic cigarettes.  If you are pregnant, do not drink alcohol.  If you are breastfeeding, limit how much and how often you drink alcohol.  Limit alcohol intake to no more than 1 drink per day for nonpregnant women. One drink equals 12 ounces of  beer, 5 ounces of wine, or 1 ounces of hard liquor.  Do not use street drugs.  Do not share needles.  Ask your health care provider for help if you need support or information about quitting drugs.  Tell your health care provider if you often feel depressed.  Tell your health care provider if you have ever been abused or do not feel safe at home. This information is not intended to replace advice given to you by your health care provider. Make sure you discuss any questions you have with your health care provider. Document Released: 07/27/2010 Document Revised: 06/19/2015 Document Reviewed: 10/15/2014 Elsevier Interactive Patient Education  [employer].

## 2017-11-14 NOTE — Progress Notes (Addendum)
Subjective:     Patient ID: Barbara Rosario , female    DOB: 02/04/1965 , 52 y.o.   MRN: [id number]   Her weight in Feb 228 with clothes today 234 with clothes.  Left ankle has been swelling more recently and having dull ache to her "kidney".  She is drinking approximately 64 oz of water daily.  Limited amount of salt.  Denies eating increased amounts of hidden salt.  She does not eat gluten, fast food.  She is currently renovating a house so limited amount of cardio.    Swelling - she is complaining of swelling to her bilateral lower extremities, also reports swelling around her eyes.  She will take triameterene/hctz when needed. Will take at least 2 times per week.  She is concerned she may have a problem with her kidneys. She has seen a urologist about 15 years ago and had CT scan which was essentially normal except for a possible stone.    Hypertension  This is a chronic problem. The current episode started more than 1 year ago. The problem is unchanged. Pertinent negatives include no palpitations or shortness of breath. Risk factors for coronary artery disease include obesity, post-menopausal state and sedentary lifestyle (LMP - last week prior to this was 2 months ago.). There is no history of kidney disease.       The patient states she uses none for birth control. Last LMP was No LMP recorded. (Menstrual status: Perimenopausal).. Negative for Dysmenorrhea and Negative for Menorrhagia. Negative for: breast discharge, breast lump(s), breast pain and breast self exam. Associated symptoms include abnormal vaginal bleeding. Pertinent negatives include abnormal bleeding (hematology), anxiety, decreased libido, depression, difficulty falling sleep, dyspareunia, history of infertility, nocturia, sexual dysfunction, sleep disturbances, urinary incontinence, urinary urgency, vaginal discharge and vaginal itching. Diet regular.The patient states her exercise level is  minimal  . The patient's tobacco use  is:  Social History   Tobacco Use  Smoking Status Current Every Day Smoker  Smokeless Tobacco Never Used  Tobacco Comment   0.5-1 ppd x 30 years  . She has been exposed to passive smoke. The patient's alcohol use is:  Social History   Substance and Sexual Activity  Alcohol Use No  . Alcohol/week: 0.0 standard drinks  . Additional information: Last pap 2017 with Dr. Dorothea Ogle, next one scheduled for 2020.  Past Medical History:  Diagnosis Date  . Essential hypertension 01/27/2015  . Genital warts   . Hyperlipemia   . Lower extremity edema 09/02/2014  . Palpitations 09/02/2014  . PVC's (premature ventricular contractions) 08/18/2015  . Tobacco abuse 08/18/2015  . Tobacco dependence       Current Outpatient Medications:  .  triamterene-hydrochlorothiazide (MAXZIDE-25) 37.5-25 MG tablet, Take 1 tablet as needed by mouth (swelling)., Disp: , Rfl:  .  propranolol (INDERAL) 20 MG/5ML solution, Take daily as needed by mouth (palpitations)., Disp: , Rfl:    Allergies  Allergen Reactions  . Codeine Nausea And Vomiting  . Sulfa Antibiotics Hives and Nausea And Vomiting     Review of Systems  Constitutional: Negative.   HENT: Negative.   Eyes: Negative.   Respiratory: Negative.  Negative for shortness of breath.   Cardiovascular: Negative.  Negative for palpitations.  Gastrointestinal: Negative.   Endocrine: Negative.   Genitourinary: Negative.   Musculoskeletal: Negative.   Skin: Negative.   Allergic/Immunologic: Negative.   Neurological: Negative.   Hematological: Negative.   Psychiatric/Behavioral: Negative.      Today's Vitals   11/14/17 1456  BP: 138/80  Pulse: 91  Temp: 98.6 F (37 C)  TempSrc: Oral  SpO2: 98%  Weight: 234 lb (106.1 kg)  Height: 5\' 4"  (1.626 m)  PainSc: 0-No pain   Body mass index is 40.17 kg/m.   Objective:  Physical Exam Constitutional:      Appearance: She is well-developed and well-nourished.  HENT:     Head: Normocephalic.     Right  Ear: External ear normal.     Left Ear: External ear normal.     Nose: Nose normal.     Mouth/Throat:     Mouth: Oropharynx is clear and moist.  Eyes:     Extraocular Movements: EOM normal.     Conjunctiva/sclera: Conjunctivae normal.     Pupils: Pupils are equal, round, and reactive to light.  Neck:     Musculoskeletal: Normal range of motion and neck supple.  Cardiovascular:     Rate and Rhythm: Normal rate and regular rhythm.     Heart sounds: Normal heart sounds.  Pulmonary:     Effort: Pulmonary effort is normal.     Breath sounds: Normal breath sounds.  Chest:     Breasts: Breasts are symmetrical.        Right: Normal. No mass or tenderness.        Left: Normal. No mass or tenderness.  Abdominal:     General: Bowel sounds are normal.     Palpations: Abdomen is soft.  Musculoskeletal: Normal range of motion.  Skin:    General: Skin is warm and dry.     Capillary Refill: Capillary refill takes less than 2 seconds.  Neurological:     Mental Status: She is alert and oriented to person, place, and time.  Psychiatric:        Mood and Affect: Mood and affect normal.        Behavior: Behavior normal.        Thought Content: Thought content normal.        Judgment: Judgment normal.         Assessment And Plan:     1. Encounter for general adult medical examination w/o abnormal findings . Behavior modifications discussed and diet history reviewed.   . Pt will continue to exercise regularly and modify diet with low GI, plant based foods and decrease intake of processed foods.  . Recommend intake of daily multivitamin, Vitamin D, and calcium.  . Recommend mammogram and colonoscopy for preventive screenings, as well as recommend immunizations that include influenza, TDAP, and Shingles - POCT urinalysis dipstick  2. Stress incontinence  No current medications at this time  I have discussed with her PT to help with this at this time would like to hold off.  Encouraged to  do kegel exercises.   3. Generalized edema  Trace edema noted to bilateral lower extremities  Encouraged to limit intake of salt.   If urine studies are negative refer to urologist for further evaluation  Arnette Felts, FNP

## 2017-11-15 LAB — BMP8+EGFR
BUN / CREAT RATIO: 20 (ref 9–23)
BUN: 16 mg/dL (ref 6–24)
CALCIUM: 9.6 mg/dL (ref 8.7–10.2)
CO2: 23 mmol/L (ref 20–29)
Chloride: 104 mmol/L (ref 96–106)
Creatinine, Ser: 0.8 mg/dL (ref 0.57–1.00)
GFR, EST AFRICAN AMERICAN: 99 mL/min/{1.73_m2} (ref >59)
GFR, EST NON AFRICAN AMERICAN: 86 mL/min/{1.73_m2} (ref >59)
Glucose: 91 mg/dL (ref 65–99)
POTASSIUM: 4.5 mmol/L (ref 3.5–5.2)
Sodium: 143 mmol/L (ref 134–144)

## 2017-11-15 LAB — CBC
HEMATOCRIT: 43.6 % (ref 34.0–46.6)
Hemoglobin: 14.9 g/dL (ref 11.1–15.9)
MCH: 31.1 pg (ref 26.6–33.0)
MCHC: 34.2 g/dL (ref 31.5–35.7)
MCV: 91 fL (ref 79–97)
Platelets: 308 10*3/uL (ref 150–450)
RBC: 4.79 x10E6/uL (ref 3.77–5.28)
RDW: 12.9 % (ref 12.3–15.4)
WBC: 6.9 10*3/uL (ref 3.4–10.8)

## 2017-11-15 LAB — LIPID PANEL
CHOL/HDL RATIO: 5.4 ratio — AB (ref 0.0–4.4)
Cholesterol, Total: 264 mg/dL — ABNORMAL HIGH (ref 100–199)
HDL: 49 mg/dL (ref >39)
LDL Calculated: 175 mg/dL — ABNORMAL HIGH (ref 0–99)
Triglycerides: 198 mg/dL — ABNORMAL HIGH (ref 0–149)
VLDL Cholesterol Cal: 40 mg/dL (ref 5–40)

## 2017-11-15 LAB — HIV ANTIBODY (ROUTINE TESTING W REFLEX): HIV SCREEN 4TH GENERATION: NONREACTIVE

## 2017-11-15 LAB — TSH: TSH: 1.86 u[IU]/mL (ref 0.450–4.500)

## 2017-12-07 DIAGNOSIS — M9905 Segmental and somatic dysfunction of pelvic region: Secondary | ICD-10-CM | POA: Diagnosis not present

## 2017-12-07 DIAGNOSIS — M9903 Segmental and somatic dysfunction of lumbar region: Secondary | ICD-10-CM | POA: Diagnosis not present

## 2017-12-07 DIAGNOSIS — M9902 Segmental and somatic dysfunction of thoracic region: Secondary | ICD-10-CM | POA: Diagnosis not present

## 2017-12-07 DIAGNOSIS — M5417 Radiculopathy, lumbosacral region: Secondary | ICD-10-CM | POA: Diagnosis not present

## 2017-12-28 DIAGNOSIS — G4733 Obstructive sleep apnea (adult) (pediatric): Secondary | ICD-10-CM | POA: Diagnosis not present

## 2018-01-11 DIAGNOSIS — M9905 Segmental and somatic dysfunction of pelvic region: Secondary | ICD-10-CM | POA: Diagnosis not present

## 2018-01-11 DIAGNOSIS — M9903 Segmental and somatic dysfunction of lumbar region: Secondary | ICD-10-CM | POA: Diagnosis not present

## 2018-01-11 DIAGNOSIS — M9902 Segmental and somatic dysfunction of thoracic region: Secondary | ICD-10-CM | POA: Diagnosis not present

## 2018-01-11 DIAGNOSIS — M5417 Radiculopathy, lumbosacral region: Secondary | ICD-10-CM | POA: Diagnosis not present

## 2018-02-08 DIAGNOSIS — M9905 Segmental and somatic dysfunction of pelvic region: Secondary | ICD-10-CM | POA: Diagnosis not present

## 2018-02-08 DIAGNOSIS — M9903 Segmental and somatic dysfunction of lumbar region: Secondary | ICD-10-CM | POA: Diagnosis not present

## 2018-02-08 DIAGNOSIS — M9902 Segmental and somatic dysfunction of thoracic region: Secondary | ICD-10-CM | POA: Diagnosis not present

## 2018-02-08 DIAGNOSIS — M5417 Radiculopathy, lumbosacral region: Secondary | ICD-10-CM | POA: Diagnosis not present

## 2018-03-15 DIAGNOSIS — M5417 Radiculopathy, lumbosacral region: Secondary | ICD-10-CM | POA: Diagnosis not present

## 2018-03-15 DIAGNOSIS — M9903 Segmental and somatic dysfunction of lumbar region: Secondary | ICD-10-CM | POA: Diagnosis not present

## 2018-03-15 DIAGNOSIS — M9905 Segmental and somatic dysfunction of pelvic region: Secondary | ICD-10-CM | POA: Diagnosis not present

## 2018-03-15 DIAGNOSIS — M9902 Segmental and somatic dysfunction of thoracic region: Secondary | ICD-10-CM | POA: Diagnosis not present

## 2018-04-28 ENCOUNTER — Encounter: Payer: Self-pay | Admitting: *Deleted

## 2018-05-01 ENCOUNTER — Telehealth: Payer: Self-pay

## 2018-05-01 NOTE — Telephone Encounter (Signed)
Left the pt a message to call the office back.  I called the pt because Dr. Allyne Gee wants to know if the pt has her c-pap supplies and does the pt want to do a virtual visit.

## 2018-05-01 NOTE — Telephone Encounter (Signed)
The pt was asked if she has her cpap supplies and the pt said yes. The pt said no to a virtual visit when asked and said she didn't see the need for one and she rescheduled her upcoming appt to later in May.  The pt said she didn't want to come in because of the coronavirus.

## 2018-05-15 ENCOUNTER — Ambulatory Visit: Payer: BLUE CROSS/BLUE SHIELD | Admitting: Internal Medicine

## 2018-05-23 DIAGNOSIS — Z1211 Encounter for screening for malignant neoplasm of colon: Secondary | ICD-10-CM | POA: Diagnosis not present

## 2018-05-23 DIAGNOSIS — Z1212 Encounter for screening for malignant neoplasm of rectum: Secondary | ICD-10-CM | POA: Diagnosis not present

## 2018-05-24 DIAGNOSIS — M9902 Segmental and somatic dysfunction of thoracic region: Secondary | ICD-10-CM | POA: Diagnosis not present

## 2018-05-24 DIAGNOSIS — M9905 Segmental and somatic dysfunction of pelvic region: Secondary | ICD-10-CM | POA: Diagnosis not present

## 2018-05-24 DIAGNOSIS — M9903 Segmental and somatic dysfunction of lumbar region: Secondary | ICD-10-CM | POA: Diagnosis not present

## 2018-05-24 DIAGNOSIS — M5417 Radiculopathy, lumbosacral region: Secondary | ICD-10-CM | POA: Diagnosis not present

## 2018-05-27 LAB — COLOGUARD: Cologuard: NEGATIVE

## 2018-05-31 ENCOUNTER — Telehealth: Payer: Self-pay

## 2018-05-31 ENCOUNTER — Encounter: Payer: Self-pay | Admitting: Internal Medicine

## 2018-05-31 NOTE — Telephone Encounter (Signed)
Called pt to inform her that her cologuard test came back negative. Pt is understanding.

## 2018-06-12 ENCOUNTER — Ambulatory Visit: Payer: Self-pay | Admitting: Internal Medicine

## 2018-06-21 DIAGNOSIS — M5417 Radiculopathy, lumbosacral region: Secondary | ICD-10-CM | POA: Diagnosis not present

## 2018-06-21 DIAGNOSIS — M9903 Segmental and somatic dysfunction of lumbar region: Secondary | ICD-10-CM | POA: Diagnosis not present

## 2018-06-21 DIAGNOSIS — M9901 Segmental and somatic dysfunction of cervical region: Secondary | ICD-10-CM | POA: Diagnosis not present

## 2018-06-21 DIAGNOSIS — M9905 Segmental and somatic dysfunction of pelvic region: Secondary | ICD-10-CM | POA: Diagnosis not present

## 2018-07-05 DIAGNOSIS — M9901 Segmental and somatic dysfunction of cervical region: Secondary | ICD-10-CM | POA: Diagnosis not present

## 2018-07-05 DIAGNOSIS — M5417 Radiculopathy, lumbosacral region: Secondary | ICD-10-CM | POA: Diagnosis not present

## 2018-07-05 DIAGNOSIS — M9903 Segmental and somatic dysfunction of lumbar region: Secondary | ICD-10-CM | POA: Diagnosis not present

## 2018-07-05 DIAGNOSIS — M9905 Segmental and somatic dysfunction of pelvic region: Secondary | ICD-10-CM | POA: Diagnosis not present

## 2018-07-26 DIAGNOSIS — M5417 Radiculopathy, lumbosacral region: Secondary | ICD-10-CM | POA: Diagnosis not present

## 2018-07-26 DIAGNOSIS — M9905 Segmental and somatic dysfunction of pelvic region: Secondary | ICD-10-CM | POA: Diagnosis not present

## 2018-07-26 DIAGNOSIS — M9903 Segmental and somatic dysfunction of lumbar region: Secondary | ICD-10-CM | POA: Diagnosis not present

## 2018-07-26 DIAGNOSIS — M9901 Segmental and somatic dysfunction of cervical region: Secondary | ICD-10-CM | POA: Diagnosis not present

## 2018-07-31 ENCOUNTER — Encounter: Payer: Self-pay | Admitting: Internal Medicine

## 2018-07-31 ENCOUNTER — Ambulatory Visit: Payer: BC Managed Care – PPO | Admitting: Internal Medicine

## 2018-07-31 ENCOUNTER — Other Ambulatory Visit: Payer: Self-pay

## 2018-07-31 VITALS — BP 124/76 | HR 95 | Temp 98.6°F | Ht 63.8 in | Wt 240.0 lb

## 2018-07-31 DIAGNOSIS — R635 Abnormal weight gain: Secondary | ICD-10-CM | POA: Diagnosis not present

## 2018-07-31 DIAGNOSIS — Z6841 Body Mass Index (BMI) 40.0 and over, adult: Secondary | ICD-10-CM

## 2018-07-31 DIAGNOSIS — E7800 Pure hypercholesterolemia, unspecified: Secondary | ICD-10-CM

## 2018-07-31 DIAGNOSIS — F17210 Nicotine dependence, cigarettes, uncomplicated: Secondary | ICD-10-CM | POA: Diagnosis not present

## 2018-07-31 MED ORDER — PROPRANOLOL HCL 10 MG PO TABS: ORAL_TABLET | ORAL | 1 refills | Status: AC

## 2018-07-31 MED ORDER — CLOBETASOL PROPIONATE 0.05 % EX OINT: TOPICAL_OINTMENT | CUTANEOUS | 0 refills | Status: AC

## 2018-07-31 NOTE — Patient Instructions (Addendum)
BERBERINE - GOOD FOR HIGH CHOLESTEROL   High Cholesterol  High cholesterol is a condition in which the blood has high levels of a white, waxy, fat-like substance (cholesterol). The human body needs small amounts of cholesterol. The liver makes all the cholesterol that the body needs. Extra (excess) cholesterol comes from the food that we eat. Cholesterol is carried from the liver by the blood through the blood vessels. If you have high cholesterol, deposits (plaques) may build up on the walls of your blood vessels (arteries). Plaques make the arteries narrower and stiffer. Cholesterol plaques increase your risk for heart attack and stroke. Work with your health care provider to keep your cholesterol levels in a healthy range. What increases the risk? This condition is more likely to develop in people who:  Eat foods that are high in animal fat (saturated fat) or cholesterol.  Are overweight.  Are not getting enough exercise.  Have a family history of high cholesterol. What are the signs or symptoms? There are no symptoms of this condition. How is this diagnosed? This condition may be diagnosed from the results of a blood test.  If you are older than age 52, your health care provider may check your cholesterol every 4-6 years.  You may be checked more often if you already have high cholesterol or other risk factors for heart disease. The blood test for cholesterol measures:  "Bad" cholesterol (LDL cholesterol). This is the main type of cholesterol that causes heart disease. The desired level for LDL is less than 100.  "Good" cholesterol (HDL cholesterol). This type helps to protect against heart disease by cleaning the arteries and carrying the LDL away. The desired level for HDL is 60 or higher.  Triglycerides. These are fats that the body can store or burn for energy. The desired number for triglycerides is lower than 150.  Total cholesterol. This is a measure of the total amount of  cholesterol in your blood, including LDL cholesterol, HDL cholesterol, and triglycerides. A healthy number is less than 200. How is this treated? This condition is treated with diet changes, lifestyle changes, and medicines. Diet changes  This may include eating more whole grains, fruits, vegetables, nuts, and fish.  This may also include cutting back on red meat and foods that have a lot of added sugar. Lifestyle changes  Changes may include getting at least 40 minutes of aerobic exercise 3 times a week. Aerobic exercises include walking, biking, and swimming. Aerobic exercise along with a healthy diet can help you maintain a healthy weight.  Changes may also include quitting smoking. Medicines  Medicines are usually given if diet and lifestyle changes have failed to reduce your cholesterol to healthy levels.  Your health care provider may prescribe a statin medicine. Statin medicines have been shown to reduce cholesterol, which can reduce the risk of heart disease. Follow these instructions at home: Eating and drinking If told by your health care provider:  Eat chicken (without skin), fish, veal, shellfish, ground [place] breast, and round or loin cuts of red meat.  Do not eat fried foods or fatty meats, such as hot dogs and salami.  Eat plenty of fruits, such as apples.  Eat plenty of vegetables, such as broccoli, potatoes, and carrots.  Eat beans, peas, and lentils.  Eat grains such as barley, rice, couscous, and bulgur wheat.  Eat pasta without cream sauces.  Use skim or nonfat milk, and eat low-fat or nonfat yogurt and cheeses.  Do not eat or  drink whole milk, cream, ice cream, egg yolks, or hard cheeses.  Do not eat stick margarine or tub margarines that contain trans fats (also called partially hydrogenated oils).  Do not eat saturated tropical oils, such as coconut oil and palm oil.  Do not eat cakes, cookies, crackers, or other baked goods that contain trans  fats.  General instructions  Exercise as directed by your health care provider. Increase your activity level with activities such as gardening, walking, and taking the stairs.  Take over-the-counter and prescription medicines only as told by your health care provider.  Do not use any products that contain nicotine or tobacco, such as cigarettes and e-cigarettes. If you need help quitting, ask your health care provider.  Keep all follow-up visits as told by your health care provider. This is important. Contact a health care provider if:  You are struggling to maintain a healthy diet or weight.  You need help to start on an exercise program.  You need help to stop smoking. Get help right away if:  You have chest pain.  You have trouble breathing. This information is not intended to replace advice given to you by your health care provider. Make sure you discuss any questions you have with your health care provider. Document Released: 01/11/2005 Document Revised: 01/14/2017 Document Reviewed: 07/12/2015 Elsevier Patient Education  2020 [employer].

## 2018-08-01 ENCOUNTER — Other Ambulatory Visit: Payer: Self-pay

## 2018-08-01 ENCOUNTER — Encounter: Payer: Self-pay | Admitting: Internal Medicine

## 2018-08-01 ENCOUNTER — Telehealth: Payer: Self-pay

## 2018-08-01 LAB — CMP14+EGFR
ALT: 16 IU/L (ref 0–32)
AST: 13 IU/L (ref 0–40)
Albumin/Globulin Ratio: 2 (ref 1.2–2.2)
Albumin: 4.8 g/dL (ref 3.8–4.9)
Alkaline Phosphatase: 84 IU/L (ref 39–117)
BUN/Creatinine Ratio: 19 (ref 9–23)
BUN: 16 mg/dL (ref 6–24)
Bilirubin Total: 0.3 mg/dL (ref 0.0–1.2)
CO2: 23 mmol/L (ref 20–29)
Calcium: 10.1 mg/dL (ref 8.7–10.2)
Chloride: 101 mmol/L (ref 96–106)
Creatinine, Ser: 0.84 mg/dL (ref 0.57–1.00)
GFR calc Af Amer: 92 mL/min/{1.73_m2} (ref >59)
GFR calc non Af Amer: 80 mL/min/{1.73_m2} (ref >59)
Globulin, Total: 2.4 g/dL (ref 1.5–4.5)
Glucose: 94 mg/dL (ref 65–99)
Potassium: 4.4 mmol/L (ref 3.5–5.2)
Sodium: 137 mmol/L (ref 134–144)
Total Protein: 7.2 g/dL (ref 6.0–8.5)

## 2018-08-01 LAB — LIPID PANEL
Chol/HDL Ratio: 5.3 ratio — ABNORMAL HIGH (ref 0.0–4.4)
Cholesterol, Total: 249 mg/dL — ABNORMAL HIGH (ref 100–199)
HDL: 47 mg/dL (ref >39)
LDL Calculated: 161 mg/dL — ABNORMAL HIGH (ref 0–99)
Triglycerides: 206 mg/dL — ABNORMAL HIGH (ref 0–149)
VLDL Cholesterol Cal: 41 mg/dL — ABNORMAL HIGH (ref 5–40)

## 2018-08-01 LAB — HEMOGLOBIN A1C
Est. average glucose Bld gHb Est-mCnc: 117 mg/dL
Hgb A1c MFr Bld: 5.7 % — ABNORMAL HIGH (ref 4.8–5.6)

## 2018-08-01 LAB — INSULIN, RANDOM: INSULIN: 23 u[IU]/mL (ref 2.6–24.9)

## 2018-08-01 MED ORDER — RYBELSUS 7 MG PO TABS: 1.0000 | ORAL_TABLET | Freq: Every day | ORAL | Status: DC

## 2018-08-01 NOTE — Telephone Encounter (Signed)
The pt was notified that her samples of Rybelsus is ready for pickup and she was also scheduled for a f/u in 4 weeks.

## 2018-08-03 DIAGNOSIS — H7111 Cholesteatoma of tympanum, right ear: Secondary | ICD-10-CM | POA: Diagnosis not present

## 2018-08-03 DIAGNOSIS — H9211 Otorrhea, right ear: Secondary | ICD-10-CM | POA: Diagnosis not present

## 2018-08-03 DIAGNOSIS — B078 Other viral warts: Secondary | ICD-10-CM | POA: Diagnosis not present

## 2018-08-06 NOTE — Progress Notes (Signed)
Subjective:     Patient ID: Barbara Rosario , female    DOB: April 04, 1965 , 53 y.o.   MRN: [id number]   Chief Complaint  Patient presents with  . Hyperlipidemia    HPI  She is here today for a cholesterol check. She is not taking any rx meds. She does not wish to. She is also followed by Cardiology, Dr. Oval Rosario. CT scoring was zero for CAD.   Hyperlipidemia This is a chronic problem. The problem is uncontrolled. Exacerbating diseases include obesity. Pertinent negatives include no chest pain or shortness of breath. She is currently on no antihyperlipidemic treatment.     Past Medical History:  Diagnosis Date  . Essential hypertension 01/27/2015  . Genital warts   . Hyperlipemia   . Lower extremity edema 09/02/2014  . Palpitations 09/02/2014  . PVC's (premature ventricular contractions) 08/18/2015  . Tobacco abuse 08/18/2015  . Tobacco dependence      Family History  Problem Relation Age of Onset  . Cancer Father   . Heart attack Paternal Grandfather   . Heart disease Paternal Grandfather      Current Outpatient Medications:  .  clobetasol ointment (TEMOVATE) 0.05 %, Apply to affected daily as needed, Disp: 30 g, Rfl: 0 .  propranolol (INDERAL) 10 MG tablet, Take 1 tablet by mouth daily, Disp: 90 tablet, Rfl: 1 .  Magnesium Malate 1250 (141.7 Mg) MG TABS, Take by mouth., Disp: , Rfl:  .  MAGNESIUM PO, Take 273 mg by mouth., Disp: , Rfl:  .  Semaglutide (RYBELSUS) 7 MG TABS, Take 1 tablet by mouth daily before breakfast., Disp: 30 tablet, Rfl:  .  triamterene-hydrochlorothiazide (MAXZIDE-25) 37.5-25 MG tablet, Take 1 tablet as needed by mouth (swelling)., Disp: , Rfl:    Allergies  Allergen Reactions  . Codeine Nausea And Vomiting  . Sulfa Antibiotics Hives and Nausea And Vomiting     Review of Systems  Constitutional: Negative.   Respiratory: Negative.  Negative for shortness of breath.   Cardiovascular: Negative.  Negative for chest pain.  Gastrointestinal: Negative.    Neurological: Negative.   Psychiatric/Behavioral: Negative.      Today's Vitals   07/31/18 1459  BP: 124/76  Pulse: 95  Temp: 98.6 F (37 C)  TempSrc: Oral  Weight: 240 lb (108.9 kg)  Height: 5' 3.8" (1.621 m)   Body mass index is 41.45 kg/m.   Objective:  Physical Exam Vitals signs and nursing note reviewed.  Constitutional:      Appearance: Normal appearance.  HENT:     Head: Normocephalic and atraumatic.  Cardiovascular:     Rate and Rhythm: Normal rate and regular rhythm.     Heart sounds: Normal heart sounds.  Pulmonary:     Effort: Pulmonary effort is normal.     Breath sounds: Normal breath sounds.  Skin:    General: Skin is warm.  Neurological:     General: No focal deficit present.     Mental Status: She is alert.  Psychiatric:        Mood and Affect: Mood normal.        Behavior: Behavior normal.         Assessment And Plan:     1. Pure hypercholesterolemia  I will consider advanced lipid testing in the future. She does not wish to take statins. She is encouraged to consider berberine. She is willing to consider supplementation with herbal treatments. I will make further recommendations once her labs are available for review.   -  Lipid panel  2. Cigarette nicotine dependence without complication  Tobacco history reviewed in full detail. She is encouraged to cut back on number of cigs smoked per day. We discussed this for greater than 3 minutes. She does not yet meet criteria for low dose CT screen.   3. Weight gain  We discussed use of off-label treatments for weight loss. She may benefit from GLP-1 agonist. Pt denies h/o thyroid cancer. She prefers oral therapy over injectable options. We discussed use of Rybelsus to address insulin resistance if present. I will check labs as listed below and make further recommendations once her labs are available for review.   - Insulin, random(561) - Hemoglobin A1c - CMP14+EGFR  4. Class 3 severe obesity  due to excess calories with serious comorbidity and body mass index (BMI) of 40.0 to 44.9 in adult Barbara Rosario - [place])  Importance of achieving optimal weight to decrease risk of cardiovascular disease and cancers was discussed with the patient in full detail. She is encouraged to start slowly - start with 10 minutes twice daily at least three to four days per week and to gradually build to 30 minutes five days weekly. She was given tips to incorporate more activity into her daily routine - take stairs when possible, park farther away from her job, grocery stores, etc.    Barbara Greenland, MD    THE PATIENT IS ENCOURAGED TO PRACTICE SOCIAL DISTANCING DUE TO THE COVID-19 PANDEMIC.

## 2018-08-11 DIAGNOSIS — R350 Frequency of micturition: Secondary | ICD-10-CM | POA: Diagnosis not present

## 2018-08-11 DIAGNOSIS — N3942 Incontinence without sensory awareness: Secondary | ICD-10-CM | POA: Diagnosis not present

## 2018-08-11 DIAGNOSIS — N3946 Mixed incontinence: Secondary | ICD-10-CM | POA: Diagnosis not present

## 2018-08-11 DIAGNOSIS — R351 Nocturia: Secondary | ICD-10-CM | POA: Diagnosis not present

## 2018-08-16 DIAGNOSIS — M5417 Radiculopathy, lumbosacral region: Secondary | ICD-10-CM | POA: Diagnosis not present

## 2018-08-16 DIAGNOSIS — M9901 Segmental and somatic dysfunction of cervical region: Secondary | ICD-10-CM | POA: Diagnosis not present

## 2018-08-16 DIAGNOSIS — M9903 Segmental and somatic dysfunction of lumbar region: Secondary | ICD-10-CM | POA: Diagnosis not present

## 2018-08-16 DIAGNOSIS — M9905 Segmental and somatic dysfunction of pelvic region: Secondary | ICD-10-CM | POA: Diagnosis not present

## 2018-08-18 DIAGNOSIS — R350 Frequency of micturition: Secondary | ICD-10-CM | POA: Diagnosis not present

## 2018-08-18 DIAGNOSIS — R351 Nocturia: Secondary | ICD-10-CM | POA: Diagnosis not present

## 2018-08-18 DIAGNOSIS — N3946 Mixed incontinence: Secondary | ICD-10-CM | POA: Diagnosis not present

## 2018-08-21 DIAGNOSIS — H9211 Otorrhea, right ear: Secondary | ICD-10-CM | POA: Diagnosis not present

## 2018-08-21 DIAGNOSIS — H7111 Cholesteatoma of tympanum, right ear: Secondary | ICD-10-CM | POA: Diagnosis not present

## 2018-08-30 ENCOUNTER — Other Ambulatory Visit: Payer: Self-pay

## 2018-08-30 ENCOUNTER — Encounter: Payer: Self-pay | Admitting: Internal Medicine

## 2018-08-30 ENCOUNTER — Ambulatory Visit (INDEPENDENT_AMBULATORY_CARE_PROVIDER_SITE_OTHER): Payer: BC Managed Care – PPO | Admitting: Internal Medicine

## 2018-08-30 VITALS — BP 116/74 | HR 90 | Temp 98.5°F | Ht 63.8 in | Wt 236.0 lb

## 2018-08-30 DIAGNOSIS — E8881 Metabolic syndrome: Secondary | ICD-10-CM | POA: Diagnosis not present

## 2018-08-30 DIAGNOSIS — Z6841 Body Mass Index (BMI) 40.0 and over, adult: Secondary | ICD-10-CM

## 2018-08-30 NOTE — Progress Notes (Signed)
  Subjective:     Patient ID: Barbara Rosario , female    DOB: 12/07/1965 , 53 y.o.   MRN: [id number]   Chief Complaint  Patient presents with  . Rybelsus f/u    HPI  She is here today for f/u insulin resistance. She was started on Rybelsus, 3mg  once daily to address this issue. She has not had any side effects from the medication. She has noticed that she has a decreased appetite.     Past Medical History:  Diagnosis Date  . Essential hypertension 01/27/2015  . Genital warts   . Hyperlipemia   . Lower extremity edema 09/02/2014  . Palpitations 09/02/2014  . PVC's (premature ventricular contractions) 08/18/2015  . Tobacco abuse 08/18/2015  . Tobacco dependence      Family History  Problem Relation Age of Onset  . Cancer Father   . Heart attack Paternal Grandfather   . Heart disease Paternal Grandfather      Current Outpatient Medications:  .  clobetasol ointment (TEMOVATE) 0.05 %, Apply to affected daily as needed, Disp: 30 g, Rfl: 0 .  magnesium gluconate (MAGONATE) 500 MG tablet, Take 500 mg by mouth daily., Disp: , Rfl:  .  propranolol (INDERAL) 10 MG tablet, Take 1 tablet by mouth daily, Disp: 90 tablet, Rfl: 1 .  Semaglutide (RYBELSUS) 7 MG TABS, Take 1 tablet by mouth daily before breakfast., Disp: 30 tablet, Rfl:  .  triamterene-hydrochlorothiazide (MAXZIDE-25) 37.5-25 MG tablet, Take 1 tablet as needed by mouth (swelling)., Disp: , Rfl:    Allergies  Allergen Reactions  . Codeine Nausea And Vomiting  . Sulfa Antibiotics Hives and Nausea And Vomiting     Review of Systems  Constitutional: Negative.   Respiratory: Negative.   Cardiovascular: Negative.   Gastrointestinal: Negative.   Neurological: Negative.   Psychiatric/Behavioral: Negative.      Today's Vitals   08/30/18 1215  BP: 116/74  Pulse: 90  Temp: 98.5 F (36.9 C)  TempSrc: Oral  Weight: 236 lb (107 kg)  Height: 5' 3.8" (1.621 m)   Body mass index is 40.76 kg/m.   Objective:  Physical  Exam Vitals signs and nursing note reviewed.  Constitutional:      Appearance: Normal appearance.  HENT:     Head: Normocephalic and atraumatic.  Cardiovascular:     Rate and Rhythm: Normal rate and regular rhythm.     Heart sounds: Normal heart sounds.  Pulmonary:     Effort: Pulmonary effort is normal.     Breath sounds: Normal breath sounds.  Skin:    General: Skin is warm.  Neurological:     General: No focal deficit present.     Mental Status: She is alert.  Psychiatric:        Mood and Affect: Mood normal.        Behavior: Behavior normal.         Assessment And Plan:     1. Insulin resistance  She wishes to continue with 3mg  dose of Rybelsus.  She is concerned that the higher dose will further decrease her appetite. She agrees to rto in 8 weeks for re-evaluation. I will check an insulin level at that time.   2. BMI 40.0-44.9, adult [facility])  She was congratulated on her four pound weight loss. She is encouraged to keep up the great work.   Maximino Greenland, MD    THE PATIENT IS ENCOURAGED TO PRACTICE SOCIAL DISTANCING DUE TO THE COVID-19 PANDEMIC.

## 2018-08-30 NOTE — Patient Instructions (Signed)
Metabolic Syndrome Metabolic syndrome occurs when you have a combination of three or more factors that increase your chances of developing cardiovascular disease and diabetes. These factors include:  High fasting blood sugar (glucose).  High blood triglyceride level.  High blood pressure.  Low levels of high-density lipoprotein (HDL) blood cholesterol.  Having a waist measurement that is: ? More than 40 inches in men. ? More than 35 inches in women. Metabolic syndrome is sometimes called insulin resistance syndrome or syndrome X. What are the causes? The exact cause of this condition is not known. It may be related to a combination of the factors that were passed down from your parents (genes) and things that you do, eat, and drink (lifestyle choices). What increases the risk? You are more likely to develop this condition if you:  Eat a diet high in calories and saturated fat.  Do not exercise regularly.  Are obese.  Have a family history of type 2 diabetes mellitus.  Have insulin resistance.  Have a history of gestational diabetes during a previous pregnancy.  Have conditions such as cardiovascular disease, nonalcoholic fatty liver disease, or polycystic ovary syndrome (PCOS).  Are older. The risk increases with age.  Use any tobacco products, including cigarettes, chewing tobacco, or e-cigarettes. What are the signs or symptoms? Metabolic syndrome has no specific symptoms. Having abnormal blood test results may be the only signs of metabolic syndrome. How is this diagnosed? This condition may be diagnosed based on:  Your blood pressure measurements.  Your waist measurement.  Blood tests.  Your personal and family medical history. How is this treated? Treatment may include:  Lifestyle changes to reduce your risk for heart disease, stroke, and diabetes, such as: ? Exercise. ? Weight loss. ? Eating a healthy diet. ? Stopping tobacco and nicotine  use.  Medicines that: ? Help your body maintain normal blood glucose levels. ? Lower your blood pressure and your blood triglyceride levels. Follow these instructions at home:      Take over-the-counter and prescription medicines only as told by your health care provider.  Exercise regularly, as told by your health care provider.  Eat a healthy diet that includes fresh fruits and vegetables, whole grains, lean proteins, and low-fat or nonfat dairy products.  Maintain a healthy weight. Work with your health care provider to lose weight safely, if needed.  Do not use any products that contain nicotine or tobacco, such as cigarettes and e-cigarettes. If you need help quitting, ask your health care provider.  If directed, measure your waist regularly and write down the measurements. To measure your waist: ? Stand up straight. ? Breathe out. ? Wrap a measuring tape around the part of your waist that is just above your hip bones. ? Read and write down the measurement.  Keep all follow-up visits as told by your health care provider. This is important. Contact a health care provider if:  You feel very tired.  You are extremely thirsty.  You urinate a lot more than usual.  Your waist gets bigger.  You have headaches that do not go away. Get help right away if:  You suddenly develop any of the following: ? Dizziness. ? Blurry vision. ? Trouble speaking. ? Trouble swallowing. ? Weakness in an arm or leg. ? Chest pain. ? Trouble breathing.  Your heartbeat feels abnormal.  You faint. Summary  Metabolic syndrome occurs when you have a combination of three or more factors that increase your chances of developing cardiovascular disease and   diabetes.  These factors include a high fasting blood sugar (glucose), high blood triglyceride level, high blood pressure, low levels of high-density lipoprotein (HDL) blood cholesterol, and a waist measurement that is more than 40 inches in  men or more than 35 inches in women.  Metabolic syndrome has no specific symptoms. Having abnormal blood test results may be the only signs of metabolic syndrome.  Treatment may include lifestyle changes and medicine to reduce your risk for heart disease, stroke, and diabetes. This information is not intended to replace advice given to you by your health care provider. Make sure you discuss any questions you have with your health care provider. Document Released: 04/20/2007 Document Revised: 01/24/2017 Document Reviewed: 01/24/2017 Elsevier Patient Education  2020 [employer]  

## 2018-09-06 DIAGNOSIS — M5417 Radiculopathy, lumbosacral region: Secondary | ICD-10-CM | POA: Diagnosis not present

## 2018-09-06 DIAGNOSIS — M9905 Segmental and somatic dysfunction of pelvic region: Secondary | ICD-10-CM | POA: Diagnosis not present

## 2018-09-06 DIAGNOSIS — M9903 Segmental and somatic dysfunction of lumbar region: Secondary | ICD-10-CM | POA: Diagnosis not present

## 2018-09-06 DIAGNOSIS — M9901 Segmental and somatic dysfunction of cervical region: Secondary | ICD-10-CM | POA: Diagnosis not present

## 2018-09-11 ENCOUNTER — Ambulatory Visit: Payer: Self-pay | Admitting: Internal Medicine

## 2018-09-18 LAB — HM MAMMOGRAPHY: HM Mammogram: NORMAL (ref 0–4)

## 2018-09-27 DIAGNOSIS — M9901 Segmental and somatic dysfunction of cervical region: Secondary | ICD-10-CM | POA: Diagnosis not present

## 2018-09-27 DIAGNOSIS — M5417 Radiculopathy, lumbosacral region: Secondary | ICD-10-CM | POA: Diagnosis not present

## 2018-09-27 DIAGNOSIS — M9903 Segmental and somatic dysfunction of lumbar region: Secondary | ICD-10-CM | POA: Diagnosis not present

## 2018-09-27 DIAGNOSIS — M9905 Segmental and somatic dysfunction of pelvic region: Secondary | ICD-10-CM | POA: Diagnosis not present

## 2018-10-03 ENCOUNTER — Encounter: Payer: Self-pay | Admitting: Internal Medicine

## 2018-10-05 ENCOUNTER — Other Ambulatory Visit: Payer: Self-pay

## 2018-10-05 MED ORDER — RYBELSUS 7 MG PO TABS: 1.0000 | ORAL_TABLET | Freq: Every day | ORAL | 1 refills | Status: DC

## 2018-10-16 DIAGNOSIS — H9211 Otorrhea, right ear: Secondary | ICD-10-CM | POA: Diagnosis not present

## 2018-10-16 DIAGNOSIS — H7111 Cholesteatoma of tympanum, right ear: Secondary | ICD-10-CM | POA: Diagnosis not present

## 2018-10-16 DIAGNOSIS — H6981 Other specified disorders of Eustachian tube, right ear: Secondary | ICD-10-CM | POA: Diagnosis not present

## 2018-10-18 DIAGNOSIS — M9903 Segmental and somatic dysfunction of lumbar region: Secondary | ICD-10-CM | POA: Diagnosis not present

## 2018-10-18 DIAGNOSIS — M5417 Radiculopathy, lumbosacral region: Secondary | ICD-10-CM | POA: Diagnosis not present

## 2018-10-18 DIAGNOSIS — M9905 Segmental and somatic dysfunction of pelvic region: Secondary | ICD-10-CM | POA: Diagnosis not present

## 2018-10-18 DIAGNOSIS — M9901 Segmental and somatic dysfunction of cervical region: Secondary | ICD-10-CM | POA: Diagnosis not present

## 2018-10-20 DIAGNOSIS — N3946 Mixed incontinence: Secondary | ICD-10-CM | POA: Diagnosis not present

## 2018-10-20 DIAGNOSIS — N3942 Incontinence without sensory awareness: Secondary | ICD-10-CM | POA: Diagnosis not present

## 2018-10-24 ENCOUNTER — Telehealth: Payer: Self-pay | Admitting: Internal Medicine

## 2018-10-24 NOTE — Telephone Encounter (Signed)
LMOVM THAT APPT HAS BEEN RESCH TO 11/2 DUE TO RS NOT IN THE OFFICE

## 2018-11-15 DIAGNOSIS — M9905 Segmental and somatic dysfunction of pelvic region: Secondary | ICD-10-CM | POA: Diagnosis not present

## 2018-11-15 DIAGNOSIS — M5417 Radiculopathy, lumbosacral region: Secondary | ICD-10-CM | POA: Diagnosis not present

## 2018-11-15 DIAGNOSIS — M9901 Segmental and somatic dysfunction of cervical region: Secondary | ICD-10-CM | POA: Diagnosis not present

## 2018-11-15 DIAGNOSIS — M9903 Segmental and somatic dysfunction of lumbar region: Secondary | ICD-10-CM | POA: Diagnosis not present

## 2018-11-20 ENCOUNTER — Encounter: Payer: BLUE CROSS/BLUE SHIELD | Admitting: Internal Medicine

## 2018-11-27 ENCOUNTER — Ambulatory Visit (INDEPENDENT_AMBULATORY_CARE_PROVIDER_SITE_OTHER): Payer: BC Managed Care – PPO | Admitting: Internal Medicine

## 2018-11-27 ENCOUNTER — Encounter: Payer: Self-pay | Admitting: Internal Medicine

## 2018-11-27 ENCOUNTER — Other Ambulatory Visit: Payer: Self-pay

## 2018-11-27 VITALS — BP 138/80 | HR 80 | Temp 98.9°F | Ht 63.8 in | Wt 232.4 lb

## 2018-11-27 DIAGNOSIS — Z0000 Encounter for general adult medical examination without abnormal findings: Secondary | ICD-10-CM | POA: Diagnosis not present

## 2018-11-27 DIAGNOSIS — R030 Elevated blood-pressure reading, without diagnosis of hypertension: Secondary | ICD-10-CM

## 2018-11-27 DIAGNOSIS — Z716 Tobacco abuse counseling: Secondary | ICD-10-CM

## 2018-11-27 DIAGNOSIS — M2550 Pain in unspecified joint: Secondary | ICD-10-CM | POA: Diagnosis not present

## 2018-11-27 LAB — POCT URINALYSIS DIPSTICK
Bilirubin, UA: NEGATIVE
Glucose, UA: NEGATIVE
Ketones, UA: NEGATIVE
Leukocytes, UA: NEGATIVE
Nitrite, UA: NEGATIVE
Protein, UA: NEGATIVE
Spec Grav, UA: 1.02 (ref 1.010–1.025)
Urobilinogen, UA: 0.2 E.U./dL
pH, UA: 5.5 (ref 5.0–8.0)

## 2018-11-27 NOTE — Progress Notes (Signed)
Subjective:     Patient ID: Barbara Rosario , female    DOB: 02/01/1965 , 53 y.o.   MRN: [id number]   Chief Complaint  Patient presents with  . Annual Exam    HPI  She is here today for a full physical examination. She is followed by Dr. Dion Body for her GYN exams. Her last exam was June 2018.     Past Medical History:  Diagnosis Date  . Essential hypertension 01/27/2015  . Genital warts   . Hyperlipemia   . Lower extremity edema 09/02/2014  . Palpitations 09/02/2014  . PVC's (premature ventricular contractions) 08/18/2015  . Tobacco abuse 08/18/2015  . Tobacco dependence      Family History  Problem Relation Age of Onset  . Cancer Father   . Heart attack Paternal Grandfather   . Heart disease Paternal Grandfather      Current Outpatient Medications:  .  clobetasol ointment (TEMOVATE) 0.05 %, Apply to affected daily as needed, Disp: 30 g, Rfl: 0 .  magnesium gluconate (MAGONATE) 500 MG tablet, Take 500 mg by mouth daily., Disp: , Rfl:  .  propranolol (INDERAL) 10 MG tablet, Take 1 tablet by mouth daily, Disp: 90 tablet, Rfl: 1 .  Semaglutide (RYBELSUS) 7 MG TABS, Take 1 tablet by mouth daily before breakfast., Disp: 30 tablet, Rfl: 1 .  triamterene-hydrochlorothiazide (MAXZIDE-25) 37.5-25 MG tablet, Take 1 tablet as needed by mouth (swelling)., Disp: , Rfl:    Allergies  Allergen Reactions  . Codeine Nausea And Vomiting  . Sulfa Antibiotics Hives and Nausea And Vomiting     Review of Systems  Constitutional: Negative.   HENT: Negative.   Eyes: Negative.   Respiratory: Negative.   Cardiovascular: Negative.   Endocrine: Negative.   Genitourinary: Negative.   Musculoskeletal: Positive for arthralgias.       She c/o generalized joint pains. Denies family h/o autoimmune arthritis syndromes. She has joint stiffness upon awakening. Sx exacerbated by being seated for long periods of time.  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   Skin: Negative.    Allergic/Immunologic: Negative.   Neurological: Negative.   Hematological: Negative.   Psychiatric/Behavioral: Negative.      Today's Vitals   11/27/18 1112  BP: 138/80  Pulse: 80  Temp: 98.9 F (37.2 C)  TempSrc: Oral  Weight: 232 lb 6.4 oz (105.4 kg)  Height: 5' 3.8" (1.621 m)   Body mass index is 40.14 kg/m.   Objective:  Physical Exam Vitals signs and nursing note reviewed.  Constitutional:      Appearance: Normal appearance. She is obese.  HENT:     Head: Normocephalic and atraumatic.     Right Ear: Tympanic membrane, ear canal and external ear normal.     Left Ear: Tympanic membrane, ear canal and external ear normal.     Nose:     Comments: Deferred, masked    Mouth/Throat:     Comments: Deferred, masked Eyes:     Extraocular Movements: Extraocular movements intact.     Conjunctiva/sclera: Conjunctivae normal.     Pupils: Pupils are equal, round, and reactive to light.  Neck:     Musculoskeletal: Normal range of motion and neck supple.  Cardiovascular:     Rate and Rhythm: Normal rate and regular rhythm.     Pulses: Normal pulses.     Heart sounds: Normal heart sounds.  Pulmonary:     Effort: Pulmonary effort is normal.     Breath  sounds: Normal breath sounds.  Chest:     Breasts: Tanner Score is 5.        Right: Normal.        Left: Normal.  Abdominal:     General: Abdomen is flat. Bowel sounds are normal.     Palpations: Abdomen is soft.  Genitourinary:    Comments: deferred Musculoskeletal: Normal range of motion.  Skin:    General: Skin is warm and dry.  Neurological:     General: No focal deficit present.     Mental Status: She is alert and oriented to person, place, and time.  Psychiatric:        Mood and Affect: Mood normal.        Behavior: Behavior normal.         Assessment And Plan:     1. Encounter for general adult medical examination w/o abnormal findings  A full exam was performed. Importance of monthly self breast exams  was discussed with the patient. PATIENT HAS BEEN ADVISED TO GET 30-45 MINUTES REGULAR EXERCISE NO LESS THAN FOUR TO FIVE DAYS PER WEEK - BOTH WEIGHTBEARING EXERCISES AND AEROBIC ARE RECOMMENDED.  SHE WAS ADVISED TO FOLLOW A HEALTHY DIET WITH AT LEAST SIX FRUITS/VEGGIES PER DAY, DECREASE INTAKE OF RED MEAT, AND TO INCREASE FISH INTAKE TO TWO DAYS PER WEEK.  MEATS/FISH SHOULD NOT BE FRIED, BAKED OR BROILED IS PREFERABLE.  I SUGGEST WEARING SPF 50 SUNSCREEN ON EXPOSED PARTS AND ESPECIALLY WHEN IN THE DIRECT SUNLIGHT FOR AN EXTENDED PERIOD OF TIME.  PLEASE AVOID FAST FOOD RESTAURANTS AND INCREASE YOUR WATER INTAKE.  - POCT Urinalysis Dipstick (81002)   2. Elevated blood pressure  Patient advised that her BP is elevated today. EKG performed, no acute changes noted. She is encouraged to avoid adding salt to her foods and to incorporate more exercise into her daily routine. I will re-evaluate at her next visit.   - POCT Urinalysis Dipstick (81002)   3. Arthralgia, unspecified joint  I will check an arthritis panel. She is agreeable to Rheum referral if needed. I will make further recommendations once her labs are available for review.  She is also encouraged to follow an anti-inflammatory diet and to incorporate bone broth into her diet.   - ANA, IFA (with reflex) - CYCLIC CITRUL PEPTIDE ANTIBODY, IGG/IGA - Rheumatoid factor - Sedimentation rate - Uric acid  4. Tobacco abuse counseling  Smoking cessation instruction/counseling given:  counseled patient on the dangers of tobacco use, advised patient to stop smoking, and reviewed strategies to maximize success   5. Class 2 severe obesity due to excess calories with serious comorbidity in adult, unspecified BMI ([place])  She was congratulated on her 4 pound weight loss since August 2020. She is encouraged to strive to exercise on the weekends. She agrees to work on exercising 30 minutes on Sundays and Mondays. We will discuss further at her next  visit.    Maximino Greenland, MD    THE PATIENT IS ENCOURAGED TO PRACTICE SOCIAL DISTANCING DUE TO THE COVID-19 PANDEMIC.

## 2018-11-27 NOTE — Patient Instructions (Signed)
ZYFLAMEND - supplement for inflammation   Health Maintenance, Female Adopting a healthy lifestyle and getting preventive care are important in promoting health and wellness. Ask your health care provider about:  The right schedule for you to have regular tests and exams.  Things you can do on your own to prevent diseases and keep yourself healthy. What should I know about diet, weight, and exercise? Eat a healthy diet   Eat a diet that includes plenty of vegetables, fruits, low-fat dairy products, and lean protein.  Do not eat a lot of foods that are high in solid fats, added sugars, or sodium. Maintain a healthy weight Body mass index (BMI) is used to identify weight problems. It estimates body fat based on height and weight. Your health care provider can help determine your BMI and help you achieve or maintain a healthy weight. Get regular exercise Get regular exercise. This is one of the most important things you can do for your health. Most adults should:  Exercise for at least 150 minutes each week. The exercise should increase your heart rate and make you sweat (moderate-intensity exercise).  Do strengthening exercises at least twice a week. This is in addition to the moderate-intensity exercise.  Spend less time sitting. Even light physical activity can be beneficial. Watch cholesterol and blood lipids Have your blood tested for lipids and cholesterol at 52 years of age, then have this test every 5 years. Have your cholesterol levels checked more often if:  Your lipid or cholesterol levels are high.  You are older than 53 years of age.  You are at high risk for heart disease. What should I know about cancer screening? Depending on your health history and family history, you may need to have cancer screening at various ages. This may include screening for:  Breast cancer.  Cervical cancer.  Colorectal cancer.  Skin cancer.  Lung cancer. What should I know about  heart disease, diabetes, and high blood pressure? Blood pressure and heart disease  High blood pressure causes heart disease and increases the risk of stroke. This is more likely to develop in people who have high blood pressure readings, are of African descent, or are overweight.  Have your blood pressure checked: ? Every 3-5 years if you are 37-78 years of age. ? Every year if you are 74 years old or older. Diabetes Have regular diabetes screenings. This checks your fasting blood sugar level. Have the screening done:  Once every three years after age 70 if you are at a normal weight and have a low risk for diabetes.  More often and at a younger age if you are overweight or have a high risk for diabetes. What should I know about preventing infection? Hepatitis B If you have a higher risk for hepatitis B, you should be screened for this virus. Talk with your health care provider to find out if you are at risk for hepatitis B infection. Hepatitis C Testing is recommended for:  Everyone born from [birth year] through 1964.  Anyone with known risk factors for hepatitis C. Sexually transmitted infections (STIs)  Get screened for STIs, including gonorrhea and chlamydia, if: ? You are sexually active and are younger than 52 years of age. ? You are older than 52 years of age and your health care provider tells you that you are at risk for this type of infection. ? Your sexual activity has changed since you were last screened, and you are at increased risk for chlamydia or  gonorrhea. Ask your health care provider if you are at risk.  Ask your health care provider about whether you are at high risk for HIV. Your health care provider may recommend a prescription medicine to help prevent HIV infection. If you choose to take medicine to prevent HIV, you should first get tested for HIV. You should then be tested every 3 months for as long as you are taking the medicine. Pregnancy  If you are about to  stop having your period (premenopausal) and you may become pregnant, seek counseling before you get pregnant.  Take 400 to 800 micrograms (mcg) of folic acid every day if you become pregnant.  Ask for birth control (contraception) if you want to prevent pregnancy. Osteoporosis and menopause Osteoporosis is a disease in which the bones lose minerals and strength with aging. This can result in bone fractures. If you are 58 years old or older, or if you are at risk for osteoporosis and fractures, ask your health care provider if you should:  Be screened for bone loss.  Take a calcium or vitamin D supplement to lower your risk of fractures.  Be given hormone replacement therapy (HRT) to treat symptoms of menopause. Follow these instructions at home: Lifestyle  Do not use any products that contain nicotine or tobacco, such as cigarettes, e-cigarettes, and chewing tobacco. If you need help quitting, ask your health care provider.  Do not use street drugs.  Do not share needles.  Ask your health care provider for help if you need support or information about quitting drugs. Alcohol use  Do not drink alcohol if: ? Your health care provider tells you not to drink. ? You are pregnant, may be pregnant, or are planning to become pregnant.  If you drink alcohol: ? Limit how much you use to 0-1 drink a day. ? Limit intake if you are breastfeeding.  Be aware of how much alcohol is in your drink. In the [place] one drink equals one 12 oz bottle of beer (355 mL), one 5 oz glass of wine (148 mL), or one 1 oz glass of hard liquor (44 mL). General instructions  Schedule regular health, dental, and eye exams.  Stay current with your vaccines.  Tell your health care provider if: ? You often feel depressed. ? You have ever been abused or do not feel safe at home. Summary  Adopting a healthy lifestyle and getting preventive care are important in promoting health and wellness.  Follow your  health care provider's instructions about healthy diet, exercising, and getting tested or screened for diseases.  Follow your health care provider's instructions on monitoring your cholesterol and blood pressure. This information is not intended to replace advice given to you by your health care provider. Make sure you discuss any questions you have with your health care provider. Document Released: 07/27/2010 Document Revised: 01/04/2018 Document Reviewed: 01/04/2018 Elsevier Patient Education  2020 [employer].

## 2018-11-29 LAB — HEMOGLOBIN A1C
Est. average glucose Bld gHb Est-mCnc: 108 mg/dL
Hgb A1c MFr Bld: 5.4 % (ref 4.8–5.6)

## 2018-11-29 LAB — CBC
Hematocrit: 42.6 % (ref 34.0–46.6)
Hemoglobin: 14.3 g/dL (ref 11.1–15.9)
MCH: 30.1 pg (ref 26.6–33.0)
MCHC: 33.6 g/dL (ref 31.5–35.7)
MCV: 90 fL (ref 79–97)
Platelets: 311 10*3/uL (ref 150–450)
RBC: 4.75 x10E6/uL (ref 3.77–5.28)
RDW: 13 % (ref 11.7–15.4)
WBC: 6.8 10*3/uL (ref 3.4–10.8)

## 2018-11-29 LAB — RHEUMATOID FACTOR: Rheumatoid fact SerPl-aCnc: <10 IU/mL (ref 0.0–13.9)

## 2018-11-29 LAB — LIPID PANEL
Chol/HDL Ratio: 5.7 ratio — ABNORMAL HIGH (ref 0.0–4.4)
Cholesterol, Total: 258 mg/dL — ABNORMAL HIGH (ref 100–199)
HDL: 45 mg/dL (ref >39)
LDL Chol Calc (NIH): 181 mg/dL — ABNORMAL HIGH (ref 0–99)
Triglycerides: 173 mg/dL — ABNORMAL HIGH (ref 0–149)
VLDL Cholesterol Cal: 32 mg/dL (ref 5–40)

## 2018-11-29 LAB — SEDIMENTATION RATE: Sed Rate: 20 mm/hr (ref 0–40)

## 2018-11-29 LAB — ANTINUCLEAR ANTIBODIES, IFA: ANA Titer 1: NEGATIVE

## 2018-11-29 LAB — CYCLIC CITRUL PEPTIDE ANTIBODY, IGG/IGA: Cyclic Citrullin Peptide Ab: 5 units (ref 0–19)

## 2018-11-29 LAB — INSULIN, RANDOM: INSULIN: 21.6 u[IU]/mL (ref 2.6–24.9)

## 2018-11-29 LAB — URIC ACID: Uric Acid: 5.4 mg/dL (ref 2.5–7.1)

## 2018-12-06 ENCOUNTER — Telehealth: Payer: Self-pay

## 2018-12-06 NOTE — Telephone Encounter (Signed)
I left the pt a message that Dr. Baird Cancer wants to know if the pt wants to increase to the 14 mg of Rybelsus.

## 2018-12-07 ENCOUNTER — Other Ambulatory Visit: Payer: Self-pay

## 2018-12-07 MED ORDER — RYBELSUS 14 MG PO TABS: 1.0000 | ORAL_TABLET | Freq: Every day | ORAL | 1 refills | Status: DC

## 2018-12-11 DIAGNOSIS — H9211 Otorrhea, right ear: Secondary | ICD-10-CM | POA: Diagnosis not present

## 2018-12-11 DIAGNOSIS — H6981 Other specified disorders of Eustachian tube, right ear: Secondary | ICD-10-CM | POA: Diagnosis not present

## 2018-12-13 DIAGNOSIS — M9903 Segmental and somatic dysfunction of lumbar region: Secondary | ICD-10-CM | POA: Diagnosis not present

## 2018-12-13 DIAGNOSIS — M5417 Radiculopathy, lumbosacral region: Secondary | ICD-10-CM | POA: Diagnosis not present

## 2018-12-13 DIAGNOSIS — M9901 Segmental and somatic dysfunction of cervical region: Secondary | ICD-10-CM | POA: Diagnosis not present

## 2018-12-13 DIAGNOSIS — M9905 Segmental and somatic dysfunction of pelvic region: Secondary | ICD-10-CM | POA: Diagnosis not present

## 2018-12-16 ENCOUNTER — Encounter: Payer: Self-pay | Admitting: Internal Medicine

## 2018-12-18 ENCOUNTER — Other Ambulatory Visit: Payer: Self-pay

## 2018-12-18 MED ORDER — RYBELSUS 14 MG PO TABS: 1.0000 | ORAL_TABLET | Freq: Every day | ORAL | 1 refills | Status: DC

## 2019-01-04 DIAGNOSIS — H6981 Other specified disorders of Eustachian tube, right ear: Secondary | ICD-10-CM | POA: Diagnosis not present

## 2019-01-17 DIAGNOSIS — M9901 Segmental and somatic dysfunction of cervical region: Secondary | ICD-10-CM | POA: Diagnosis not present

## 2019-01-17 DIAGNOSIS — M9903 Segmental and somatic dysfunction of lumbar region: Secondary | ICD-10-CM | POA: Diagnosis not present

## 2019-01-17 DIAGNOSIS — M5417 Radiculopathy, lumbosacral region: Secondary | ICD-10-CM | POA: Diagnosis not present

## 2019-01-17 DIAGNOSIS — M9905 Segmental and somatic dysfunction of pelvic region: Secondary | ICD-10-CM | POA: Diagnosis not present

## 2019-02-07 ENCOUNTER — Other Ambulatory Visit: Payer: Self-pay | Admitting: Internal Medicine

## 2019-02-14 ENCOUNTER — Other Ambulatory Visit: Payer: Self-pay

## 2019-02-20 DIAGNOSIS — H6981 Other specified disorders of Eustachian tube, right ear: Secondary | ICD-10-CM | POA: Diagnosis not present

## 2019-02-20 DIAGNOSIS — Z9622 Myringotomy tube(s) status: Secondary | ICD-10-CM | POA: Diagnosis not present

## 2019-02-21 DIAGNOSIS — M9903 Segmental and somatic dysfunction of lumbar region: Secondary | ICD-10-CM | POA: Diagnosis not present

## 2019-02-21 DIAGNOSIS — M5417 Radiculopathy, lumbosacral region: Secondary | ICD-10-CM | POA: Diagnosis not present

## 2019-02-21 DIAGNOSIS — M9905 Segmental and somatic dysfunction of pelvic region: Secondary | ICD-10-CM | POA: Diagnosis not present

## 2019-02-21 DIAGNOSIS — M9901 Segmental and somatic dysfunction of cervical region: Secondary | ICD-10-CM | POA: Diagnosis not present

## 2019-03-05 ENCOUNTER — Ambulatory Visit: Payer: BC Managed Care – PPO | Admitting: Internal Medicine

## 2019-03-19 ENCOUNTER — Ambulatory Visit (INDEPENDENT_AMBULATORY_CARE_PROVIDER_SITE_OTHER): Payer: BC Managed Care – PPO | Admitting: Internal Medicine

## 2019-03-19 ENCOUNTER — Other Ambulatory Visit: Payer: Self-pay

## 2019-03-19 ENCOUNTER — Encounter: Payer: Self-pay | Admitting: Internal Medicine

## 2019-03-19 VITALS — BP 110/72 | HR 97 | Temp 98.5°F | Ht 63.8 in | Wt 227.4 lb

## 2019-03-19 DIAGNOSIS — Z6839 Body mass index (BMI) 39.0-39.9, adult: Secondary | ICD-10-CM

## 2019-03-19 DIAGNOSIS — E8881 Metabolic syndrome: Secondary | ICD-10-CM | POA: Diagnosis not present

## 2019-03-19 NOTE — Progress Notes (Signed)
This visit occurred during the SARS-CoV-2 public health emergency.  Safety protocols were in place, including screening questions prior to the visit, additional usage of staff PPE, and extensive cleaning of exam room while observing appropriate contact time as indicated for disinfecting solutions.  Subjective:     Patient ID: Barbara Rosario , female    DOB: [birth year] , 54 y.o.   MRN: [id number]   Chief Complaint  Patient presents with  . Medication Management    Rybelsus    HPI  She is here today for f/u Rybelsus. She has not had any issues with the medication.     Past Medical History:  Diagnosis Date  . Essential hypertension 01/27/2015  . Genital warts   . Hyperlipemia   . Lower extremity edema 09/02/2014  . Palpitations 09/02/2014  . PVC's (premature ventricular contractions) 08/18/2015  . Tobacco abuse 08/18/2015  . Tobacco dependence      Family History  Problem Relation Age of Onset  . Cancer Father   . Heart attack Paternal Grandfather   . Heart disease Paternal Grandfather      Current Outpatient Medications:  .  clobetasol ointment (TEMOVATE) 0.05 %, Apply to affected daily as needed, Disp: 30 g, Rfl: 0 .  magnesium gluconate (MAGONATE) 500 MG tablet, Take 500 mg by mouth daily., Disp: , Rfl:  .  propranolol (INDERAL) 10 MG tablet, Take 1 tablet by mouth daily (Patient taking differently: Take 1 tablet by mouth daily AS NEEDED), Disp: 90 tablet, Rfl: 1 .  RYBELSUS 14 MG TABS, TAKE 1 TABLET BY MOUTH DAILY, Disp: 30 tablet, Rfl: 1 .  triamterene-hydrochlorothiazide (MAXZIDE-25) 37.5-25 MG tablet, Take 1 tablet as needed by mouth (swelling)., Disp: , Rfl:    Allergies  Allergen Reactions  . Codeine Nausea And Vomiting  . Sulfa Antibiotics Hives and Nausea And Vomiting     Review of Systems  Constitutional: Negative.   Respiratory: Negative.   Cardiovascular: Negative.   Gastrointestinal: Negative.   Neurological: Negative.   Psychiatric/Behavioral: Negative.       Today's Vitals   03/19/19 1641  BP: 110/72  Pulse: 97  Temp: 98.5 F (36.9 C)  TempSrc: Oral  Weight: 227 lb 6.4 oz (103.1 kg)  Height: 5' 3.8" (1.621 m)   Body mass index is 39.28 kg/m.   Wt Readings from Last 3 Encounters:  03/19/19 227 lb 6.4 oz (103.1 kg)  11/27/18 232 lb 6.4 oz (105.4 kg)  08/30/18 236 lb (107 kg)     Objective:  Physical Exam Vitals and nursing note reviewed.  Constitutional:      Appearance: Normal appearance. She is obese.  HENT:     Head: Normocephalic and atraumatic.  Cardiovascular:     Rate and Rhythm: Normal rate and regular rhythm.     Heart sounds: Normal heart sounds.  Pulmonary:     Effort: Pulmonary effort is normal.     Breath sounds: Normal breath sounds.  Skin:    General: Skin is warm.  Neurological:     General: No focal deficit present.     Mental Status: She is alert.  Psychiatric:        Mood and Affect: Mood normal.        Behavior: Behavior normal.         Assessment And Plan:     1. Insulin resistance  I will check labs as listed below. She will continue with Rybelsus for now.   - BMP8+EGFR - Hemoglobin A1c - Insulin, random(561)  2. Class 2 severe obesity due to excess calories with serious comorbidity and body mass index (BMI) of 39.0 to 39.9 in adult [facility])  She was congratulated on her 5 pound weight loss since Nov 2020.  She is encouraged to strive for BMI less than 30 to decrease cardiac risk. She is agreeable to [facility] for further evaluation/management.   - Amb Ref to Medical Weight Management   Maximino Greenland, MD    THE PATIENT IS ENCOURAGED TO PRACTICE SOCIAL DISTANCING DUE TO THE COVID-19 PANDEMIC.

## 2019-03-20 ENCOUNTER — Encounter: Payer: Self-pay | Admitting: Internal Medicine

## 2019-03-20 LAB — BMP8+EGFR
BUN/Creatinine Ratio: 18 (ref 9–23)
BUN: 13 mg/dL (ref 6–24)
CO2: 25 mmol/L (ref 20–29)
Calcium: 9.7 mg/dL (ref 8.7–10.2)
Chloride: 105 mmol/L (ref 96–106)
Creatinine, Ser: 0.72 mg/dL (ref 0.57–1.00)
GFR calc Af Amer: 111 mL/min/{1.73_m2} (ref >59)
GFR calc non Af Amer: 97 mL/min/{1.73_m2} (ref >59)
Glucose: 90 mg/dL (ref 65–99)
Potassium: 4.3 mmol/L (ref 3.5–5.2)
Sodium: 142 mmol/L (ref 134–144)

## 2019-03-20 LAB — INSULIN, RANDOM: INSULIN: 26 u[IU]/mL — ABNORMAL HIGH (ref 2.6–24.9)

## 2019-03-20 LAB — HEMOGLOBIN A1C
Est. average glucose Bld gHb Est-mCnc: 105 mg/dL
Hgb A1c MFr Bld: 5.3 % (ref 4.8–5.6)

## 2019-03-21 DIAGNOSIS — M5417 Radiculopathy, lumbosacral region: Secondary | ICD-10-CM | POA: Diagnosis not present

## 2019-03-21 DIAGNOSIS — M9905 Segmental and somatic dysfunction of pelvic region: Secondary | ICD-10-CM | POA: Diagnosis not present

## 2019-03-21 DIAGNOSIS — M9901 Segmental and somatic dysfunction of cervical region: Secondary | ICD-10-CM | POA: Diagnosis not present

## 2019-03-21 DIAGNOSIS — M9903 Segmental and somatic dysfunction of lumbar region: Secondary | ICD-10-CM | POA: Diagnosis not present

## 2019-04-18 DIAGNOSIS — M9903 Segmental and somatic dysfunction of lumbar region: Secondary | ICD-10-CM | POA: Diagnosis not present

## 2019-04-18 DIAGNOSIS — M9905 Segmental and somatic dysfunction of pelvic region: Secondary | ICD-10-CM | POA: Diagnosis not present

## 2019-04-18 DIAGNOSIS — M9901 Segmental and somatic dysfunction of cervical region: Secondary | ICD-10-CM | POA: Diagnosis not present

## 2019-04-18 DIAGNOSIS — M5417 Radiculopathy, lumbosacral region: Secondary | ICD-10-CM | POA: Diagnosis not present

## 2019-04-23 ENCOUNTER — Encounter: Payer: Self-pay | Admitting: Internal Medicine

## 2019-04-23 ENCOUNTER — Other Ambulatory Visit: Payer: Self-pay

## 2019-04-23 MED ORDER — RYBELSUS 14 MG PO TABS: 1.0000 | ORAL_TABLET | Freq: Every day | ORAL | 1 refills | Status: DC

## 2019-05-18 DIAGNOSIS — H9211 Otorrhea, right ear: Secondary | ICD-10-CM | POA: Diagnosis not present

## 2019-05-18 DIAGNOSIS — Z9622 Myringotomy tube(s) status: Secondary | ICD-10-CM | POA: Diagnosis not present

## 2019-05-18 DIAGNOSIS — H6981 Other specified disorders of Eustachian tube, right ear: Secondary | ICD-10-CM | POA: Diagnosis not present

## 2019-05-21 ENCOUNTER — Other Ambulatory Visit: Payer: Self-pay

## 2019-05-21 ENCOUNTER — Encounter: Payer: Self-pay | Admitting: Internal Medicine

## 2019-05-21 ENCOUNTER — Ambulatory Visit (INDEPENDENT_AMBULATORY_CARE_PROVIDER_SITE_OTHER): Payer: BC Managed Care – PPO | Admitting: Internal Medicine

## 2019-05-21 VITALS — BP 124/82 | HR 99 | Temp 98.5°F | Ht 64.0 in | Wt 228.6 lb

## 2019-05-21 DIAGNOSIS — E8881 Metabolic syndrome: Secondary | ICD-10-CM | POA: Diagnosis not present

## 2019-05-21 DIAGNOSIS — E6609 Other obesity due to excess calories: Secondary | ICD-10-CM | POA: Diagnosis not present

## 2019-05-21 DIAGNOSIS — Z6839 Body mass index (BMI) 39.0-39.9, adult: Secondary | ICD-10-CM | POA: Diagnosis not present

## 2019-05-21 MED ORDER — RYBELSUS 14 MG PO TABS: 1.0000 | ORAL_TABLET | Freq: Every day | ORAL | 1 refills | Status: DC

## 2019-05-21 NOTE — Progress Notes (Signed)
This visit occurred during the SARS-CoV-2 public health emergency.  Safety protocols were in place, including screening questions prior to the visit, additional usage of staff PPE, and extensive cleaning of exam room while observing appropriate contact time as indicated for disinfecting solutions.  Subjective:     Patient ID: Barbara Rosario , female    DOB: January 31, 1965 , 54 y.o.   MRN: [id number]   Chief Complaint  Patient presents with  . Medication Refill    HPI  She presents today for refill of Rybelsus. She has been doing well on this medication. She initially had good results with weight loss, but seems to have hit a plateau.   She tried to schedule appt with [facility], but found it difficult. She reports scheduler only scheduled appt on Thursdays. Additionally, they would only schedule appts up to two weeks prior to appt.     Past Medical History:  Diagnosis Date  . Essential hypertension 01/27/2015  . Genital warts   . Hyperlipemia   . Lower extremity edema 09/02/2014  . Palpitations 09/02/2014  . PVC's (premature ventricular contractions) 08/18/2015  . Tobacco abuse 08/18/2015  . Tobacco dependence      Family History  Problem Relation Age of Onset  . Cancer Father   . Heart attack Paternal Grandfather   . Heart disease Paternal Grandfather      Current Outpatient Medications:  .  ciprofloxacin (CILOXAN) 0.3 % ophthalmic solution, Place 3 drops into the right ear 2 (two) times daily., Disp: , Rfl:  .  clobetasol ointment (TEMOVATE) 0.05 %, Apply to affected daily as needed, Disp: 30 g, Rfl: 0 .  magnesium gluconate (MAGONATE) 500 MG tablet, Take 500 mg by mouth daily., Disp: , Rfl:  .  propranolol (INDERAL) 10 MG tablet, Take 1 tablet by mouth daily (Patient taking differently: Take 1 tablet by mouth daily AS NEEDED), Disp: 90 tablet, Rfl: 1 .  Semaglutide (RYBELSUS) 14 MG TABS, Take 1 tablet by mouth daily., Disp: 30 tablet, Rfl: 1 .  triamterene-hydrochlorothiazide  (MAXZIDE-25) 37.5-25 MG tablet, Take 1 tablet as needed by mouth (swelling)., Disp: , Rfl:    Allergies  Allergen Reactions  . Codeine Nausea And Vomiting  . Sulfa Antibiotics Hives and Nausea And Vomiting     Review of Systems  Constitutional: Negative.   Respiratory: Negative.   Cardiovascular: Negative.   Gastrointestinal: Negative.   Neurological: Negative.   Psychiatric/Behavioral: Negative.      Today's Vitals   05/21/19 1145  BP: 124/82  Pulse: 99  Temp: 98.5 F (36.9 C)  Weight: 228 lb 9.6 oz (103.7 kg)  Height: 5\' 4"  (1.626 m)   Body mass index is 39.24 kg/m.   Objective:  Physical Exam Vitals and nursing note reviewed.  Constitutional:      Appearance: Normal appearance. She is obese.  HENT:     Head: Normocephalic and atraumatic.  Cardiovascular:     Rate and Rhythm: Normal rate and regular rhythm.     Heart sounds: Normal heart sounds.  Pulmonary:     Effort: Pulmonary effort is normal.     Breath sounds: Normal breath sounds.  Skin:    General: Skin is warm.  Neurological:     General: No focal deficit present.     Mental Status: She is alert.  Psychiatric:        Mood and Affect: Mood normal.        Behavior: Behavior normal.  Assessment And Plan:     1. Insulin resistance  She will continue with Rybelsus 14mg  once daily for now.    2. Class 2 obesity due to excess calories without serious comorbidity with body mass index (BMI) of 39.0 to 39.9 in adult  She has had difficulty scheduling appt at [facility]. She agrees to referral to [facility] program. She prefers Monday appts.   - Ambulatory referral to Internal Medicine    Maximino Greenland, MD    THE PATIENT IS ENCOURAGED TO PRACTICE SOCIAL DISTANCING DUE TO THE COVID-19 PANDEMIC.

## 2019-05-23 DIAGNOSIS — M9905 Segmental and somatic dysfunction of pelvic region: Secondary | ICD-10-CM | POA: Diagnosis not present

## 2019-05-23 DIAGNOSIS — M9901 Segmental and somatic dysfunction of cervical region: Secondary | ICD-10-CM | POA: Diagnosis not present

## 2019-05-23 DIAGNOSIS — M5417 Radiculopathy, lumbosacral region: Secondary | ICD-10-CM | POA: Diagnosis not present

## 2019-05-23 DIAGNOSIS — M9903 Segmental and somatic dysfunction of lumbar region: Secondary | ICD-10-CM | POA: Diagnosis not present

## 2019-06-14 ENCOUNTER — Other Ambulatory Visit: Payer: Self-pay | Admitting: Internal Medicine

## 2019-06-27 DIAGNOSIS — M9903 Segmental and somatic dysfunction of lumbar region: Secondary | ICD-10-CM | POA: Diagnosis not present

## 2019-06-27 DIAGNOSIS — M5417 Radiculopathy, lumbosacral region: Secondary | ICD-10-CM | POA: Diagnosis not present

## 2019-06-27 DIAGNOSIS — M9901 Segmental and somatic dysfunction of cervical region: Secondary | ICD-10-CM | POA: Diagnosis not present

## 2019-06-27 DIAGNOSIS — M9905 Segmental and somatic dysfunction of pelvic region: Secondary | ICD-10-CM | POA: Diagnosis not present

## 2019-08-08 ENCOUNTER — Other Ambulatory Visit: Payer: Self-pay | Admitting: Internal Medicine

## 2019-08-08 DIAGNOSIS — M9901 Segmental and somatic dysfunction of cervical region: Secondary | ICD-10-CM | POA: Diagnosis not present

## 2019-08-08 DIAGNOSIS — M9905 Segmental and somatic dysfunction of pelvic region: Secondary | ICD-10-CM | POA: Diagnosis not present

## 2019-08-08 DIAGNOSIS — M5417 Radiculopathy, lumbosacral region: Secondary | ICD-10-CM | POA: Diagnosis not present

## 2019-08-08 DIAGNOSIS — M9903 Segmental and somatic dysfunction of lumbar region: Secondary | ICD-10-CM | POA: Diagnosis not present

## 2019-09-12 DIAGNOSIS — M9903 Segmental and somatic dysfunction of lumbar region: Secondary | ICD-10-CM | POA: Diagnosis not present

## 2019-09-12 DIAGNOSIS — M9905 Segmental and somatic dysfunction of pelvic region: Secondary | ICD-10-CM | POA: Diagnosis not present

## 2019-09-12 DIAGNOSIS — M5417 Radiculopathy, lumbosacral region: Secondary | ICD-10-CM | POA: Diagnosis not present

## 2019-09-12 DIAGNOSIS — M9901 Segmental and somatic dysfunction of cervical region: Secondary | ICD-10-CM | POA: Diagnosis not present

## 2019-10-06 ENCOUNTER — Other Ambulatory Visit: Payer: Self-pay | Admitting: Internal Medicine

## 2019-10-22 DIAGNOSIS — H6981 Other specified disorders of Eustachian tube, right ear: Secondary | ICD-10-CM | POA: Diagnosis not present

## 2019-10-22 DIAGNOSIS — H7111 Cholesteatoma of tympanum, right ear: Secondary | ICD-10-CM | POA: Diagnosis not present

## 2019-10-22 DIAGNOSIS — Z9622 Myringotomy tube(s) status: Secondary | ICD-10-CM | POA: Diagnosis not present

## 2019-10-24 DIAGNOSIS — M9903 Segmental and somatic dysfunction of lumbar region: Secondary | ICD-10-CM | POA: Diagnosis not present

## 2019-10-24 DIAGNOSIS — M9901 Segmental and somatic dysfunction of cervical region: Secondary | ICD-10-CM | POA: Diagnosis not present

## 2019-10-24 DIAGNOSIS — M9905 Segmental and somatic dysfunction of pelvic region: Secondary | ICD-10-CM | POA: Diagnosis not present

## 2019-10-24 DIAGNOSIS — M5417 Radiculopathy, lumbosacral region: Secondary | ICD-10-CM | POA: Diagnosis not present

## 2019-12-06 DIAGNOSIS — M9905 Segmental and somatic dysfunction of pelvic region: Secondary | ICD-10-CM | POA: Diagnosis not present

## 2019-12-06 DIAGNOSIS — M5417 Radiculopathy, lumbosacral region: Secondary | ICD-10-CM | POA: Diagnosis not present

## 2019-12-06 DIAGNOSIS — M9901 Segmental and somatic dysfunction of cervical region: Secondary | ICD-10-CM | POA: Diagnosis not present

## 2019-12-06 DIAGNOSIS — M9903 Segmental and somatic dysfunction of lumbar region: Secondary | ICD-10-CM | POA: Diagnosis not present

## 2019-12-10 ENCOUNTER — Encounter: Payer: BC Managed Care – PPO | Admitting: Internal Medicine

## 2019-12-26 DIAGNOSIS — M9903 Segmental and somatic dysfunction of lumbar region: Secondary | ICD-10-CM | POA: Diagnosis not present

## 2019-12-26 DIAGNOSIS — M9901 Segmental and somatic dysfunction of cervical region: Secondary | ICD-10-CM | POA: Diagnosis not present

## 2019-12-26 DIAGNOSIS — M5417 Radiculopathy, lumbosacral region: Secondary | ICD-10-CM | POA: Diagnosis not present

## 2019-12-26 DIAGNOSIS — M9905 Segmental and somatic dysfunction of pelvic region: Secondary | ICD-10-CM | POA: Diagnosis not present

## 2020-02-18 ENCOUNTER — Encounter: Payer: BC Managed Care – PPO | Admitting: Internal Medicine

## 2020-05-30 ENCOUNTER — Emergency Department (HOSPITAL_COMMUNITY)
Admission: EM | Admit: 2020-05-30 | Discharge: 2020-05-30 | Disposition: A | Discharge status: Home / Self Care | Payer: BLUE CROSS/BLUE SHIELD | Attending: Emergency Medicine | Admitting: Emergency Medicine

## 2020-05-30 ENCOUNTER — Other Ambulatory Visit: Payer: Self-pay

## 2020-05-30 ENCOUNTER — Encounter (HOSPITAL_COMMUNITY): Payer: Self-pay

## 2020-05-30 DIAGNOSIS — Z79899 Other long term (current) drug therapy: Secondary | ICD-10-CM | POA: Insufficient documentation

## 2020-05-30 DIAGNOSIS — I10 Essential (primary) hypertension: Secondary | ICD-10-CM | POA: Insufficient documentation

## 2020-05-30 DIAGNOSIS — R002 Palpitations: Secondary | ICD-10-CM | POA: Diagnosis not present

## 2020-05-30 DIAGNOSIS — R55 Syncope and collapse: Secondary | ICD-10-CM | POA: Insufficient documentation

## 2020-05-30 DIAGNOSIS — Z87891 Personal history of nicotine dependence: Secondary | ICD-10-CM | POA: Diagnosis not present

## 2020-05-30 LAB — BASIC METABOLIC PANEL
Anion gap: 8 (ref 5–15)
BUN: 13 mg/dL (ref 6–20)
CO2: 25 mmol/L (ref 22–32)
Calcium: 9.4 mg/dL (ref 8.9–10.3)
Chloride: 107 mmol/L (ref 98–111)
Creatinine, Ser: 0.84 mg/dL (ref 0.44–1.00)
GFR, Estimated: >60 mL/min (ref >60)
Glucose, Bld: 110 mg/dL — ABNORMAL HIGH (ref 70–99)
Potassium: 4.2 mmol/L (ref 3.5–5.1)
Sodium: 140 mmol/L (ref 135–145)

## 2020-05-30 LAB — CBC
HCT: 40.3 % (ref 36.0–46.0)
Hemoglobin: 13.3 g/dL (ref 12.0–15.0)
MCH: 30.4 pg (ref 26.0–34.0)
MCHC: 33 g/dL (ref 30.0–36.0)
MCV: 92 fL (ref 80.0–100.0)
Platelets: 274 10*3/uL (ref 150–400)
RBC: 4.38 MIL/uL (ref 3.87–5.11)
RDW: 12.7 % (ref 11.5–15.5)
WBC: 6.4 10*3/uL (ref 4.0–10.5)
nRBC: 0 % (ref 0.0–0.2)

## 2020-05-30 LAB — I-STAT BETA HCG BLOOD, ED (MC, WL, AP ONLY): I-stat hCG, quantitative: <5 m[IU]/mL (ref <5)

## 2020-05-30 LAB — CBG MONITORING, ED: Glucose-Capillary: 102 mg/dL — ABNORMAL HIGH (ref 70–99)

## 2020-05-30 NOTE — Discharge Instructions (Addendum)
Follow-up with your cardiologist.  Turn as needed for worsening symptoms

## 2020-05-30 NOTE — ED Provider Notes (Signed)
[facility]-EMERGENCY DEPT Provider Note   CSN: [id number] Arrival date & time: 05/30/20  1051     History Chief Complaint  Patient presents with  . Palpitations  . Near Syncope    Barbara Rosario is a 55 y.o. female.  HPI   Patient presented to the ED for evaluation of palpitations.  Patient states she was at work when she had sudden onset of feeling like her heart skipped a beat but then it paused.  She suddenly became very lightheaded she felt weakness in her extremities and her vision started to gray out.  She felt like she was going to pass out.  This lasted for several seconds and then resolved.  Patient has had issues with palpitations in the past.  She states she wears a cardiac monitor.  She also has a prescription for Inderal to take for palpitations.  Patient's coworkers were very concerned by her parents and wanted to come to the emergency room to get checked out.  Right now she is feeling well.  She is not having any chest pain or shortness of breath  Past Medical History:  Diagnosis Date  . Essential hypertension 01/27/2015  . Genital warts   . Hyperlipemia   . Lower extremity edema 09/02/2014  . Palpitations 09/02/2014  . PVC's (premature ventricular contractions) 08/18/2015  . Tobacco abuse 08/18/2015  . Tobacco dependence     Patient Active Problem List   Diagnosis Date Noted  . Tobacco abuse 08/18/2015  . PVC's (premature ventricular contractions) 08/18/2015  . Essential hypertension 01/27/2015  . OSA on CPAP 12/26/2014  . Lower extremity edema 09/02/2014  . Hyperlipidemia 06/13/2013    Past Surgical History:  Procedure Laterality Date  . DILATION AND CURETTAGE OF UTERUS    . mastiotympanostomy    . nasal polyps       OB History   No obstetric history on file.     Family History  Problem Relation Age of Onset  . Cancer Father   . Heart attack Paternal Grandfather   . Heart disease Paternal Grandfather     Social History    Tobacco Use  . Smoking status: Former Smoker    Packs/day: 0.25    Years: 40.00    Pack years: 10.00    Types: Cigarettes  . Smokeless tobacco: Never Used  . Tobacco comment: 0.5-1 ppd x 30 years, not yet ready to quit  Vaping Use  . Vaping Use: Every day  . Substances: Nicotine, Flavoring  Substance Use Topics  . Alcohol use: No    Alcohol/week: 0.0 standard drinks  . Drug use: No    Home Medications Prior to Admission medications   Medication Sig Start Date End Date Taking? Authorizing Provider  ciprofloxacin (CILOXAN) 0.3 % ophthalmic solution Place 3 drops into the right ear 2 (two) times daily. 05/18/19   [provider]  clobetasol ointment (TEMOVATE) 0.05 % Apply to affected daily as needed 07/31/18   Dorothyann Peng, MD  magnesium gluconate (MAGONATE) 500 MG tablet Take 500 mg by mouth daily.    [provider]  propranolol (INDERAL) 10 MG tablet Take 1 tablet by mouth daily Patient taking differently: Take 1 tablet by mouth daily AS NEEDED 07/31/18   Dorothyann Peng, MD  RYBELSUS 14 MG TABS TAKE 1 TABLET BY MOUTH DAILY 10/08/19   Dorothyann Peng, MD  triamterene-hydrochlorothiazide (MAXZIDE-25) 37.5-25 MG tablet Take 1 tablet as needed by mouth (swelling).    [provider]  Allergies    Codeine and Sulfa antibiotics  Review of Systems   Review of Systems  All other systems reviewed and are negative.   Physical Exam Updated Vital Signs BP 124/67   Pulse 74   Temp 99 F (37.2 C) (Oral)   Resp (!) 21   Ht 1.626 m (5\' 4" )   Wt 106.6 kg   SpO2 96%   BMI 40.34 kg/m   Physical Exam Vitals and nursing note reviewed.  Constitutional:      General: She is not in acute distress.    Appearance: She is well-developed.  HENT:     Head: Normocephalic and atraumatic.     Right Ear: External ear normal.     Left Ear: External ear normal.  Eyes:     General: No scleral icterus.       Right eye: No discharge.        Left eye: No  discharge.     Conjunctiva/sclera: Conjunctivae normal.  Neck:     Trachea: No tracheal deviation.  Cardiovascular:     Rate and Rhythm: Normal rate and regular rhythm.  Pulmonary:     Effort: Pulmonary effort is normal. No respiratory distress.     Breath sounds: Normal breath sounds. No stridor. No wheezing or rales.  Abdominal:     General: Bowel sounds are normal. There is no distension.     Palpations: Abdomen is soft.     Tenderness: There is no abdominal tenderness. There is no guarding or rebound.  Musculoskeletal:        General: No tenderness.     Cervical back: Neck supple.  Skin:    General: Skin is warm and dry.     Findings: No rash.  Neurological:     Mental Status: She is alert.     Cranial Nerves: No cranial nerve deficit (no facial droop, extraocular movements intact, no slurred speech).     Sensory: No sensory deficit.     Motor: No abnormal muscle tone or seizure activity.     Coordination: Coordination normal.     ED Results / Procedures / Treatments   Labs (all labs ordered are listed, but only abnormal results are displayed) Labs Reviewed  BASIC METABOLIC PANEL - Abnormal; Notable for the following components:      Result Value   Glucose, Bld 110 (*)    All other components within normal limits  CBG MONITORING, ED - Abnormal; Notable for the following components:   Glucose-Capillary 102 (*)    All other components within normal limits  CBC  I-STAT BETA HCG BLOOD, ED ([facility], [facility], [facility] ONLY)    EKG EKG Interpretation  Date/Time:  Friday May 30 2020 11:10:56 EDT Ventricular Rate:  84 PR Interval:  144 QRS Duration: 83 QT Interval:  378 QTC Calculation: 447 R Axis:   43 Text Interpretation: Sinus rhythm Low voltage, precordial leads 12 Lead; Mason-Likar No significant change since last tracing Confirmed by 05-10-2001 [phone]) on 05/30/2020 11:19:24 AM   Radiology No results found.  Procedures Procedures   Medications Ordered in ED Medications -  No data to display  ED Course  I have reviewed the triage vital signs and the nursing notes.  Pertinent labs & imaging results that were available during my care of the patient were reviewed by me and considered in my medical decision making (see chart for details).  Clinical Course as of 05/30/20 1501  Fri May 30, 2020  1500 CBC and metabolic  panel normal. [JK]    Clinical Course User Index [JK] Linwood Dibbles, MD   MDM Rules/Calculators/A&P                          Patient presented to the ED for evaluation of a near syncopal episode work.  Patient states she has had a history of this in the past.  She has worn outpatient cardiac monitors.  She is also had an echocardiogram.  In the ED the patient's heart rhythm was normal.  No signs of bradycardia or tacky dysrhythmia.  ED work-up is otherwise reassuring. Final Clinical Impression(s) / ED Diagnoses Final diagnoses:  Palpitations  Near syncope    Rx / DC Orders ED Discharge Orders    None       Linwood Dibbles, MD 05/30/20 1501

## 2020-05-30 NOTE — ED Triage Notes (Signed)
Patient states while at work she began having palpitations and had a pulse ox on. Patient states her HR ranged from 45-108 and sats 80% to 98%. Patient states she was lightheaded and felt like she was going to pass out.

## 2021-06-12 ENCOUNTER — Telehealth: Payer: Self-pay

## 2021-06-15 NOTE — Telephone Encounter (Signed)
error 

## 2021-07-09 ENCOUNTER — Other Ambulatory Visit: Payer: Self-pay | Admitting: Nurse Practitioner

## 2021-07-09 ENCOUNTER — Other Ambulatory Visit: Payer: Self-pay

## 2021-07-09 ENCOUNTER — Other Ambulatory Visit (HOSPITAL_COMMUNITY)
Admission: RE | Admit: 2021-07-09 | Discharge: 2021-07-09 | Disposition: A | Discharge status: Home / Self Care | Payer: Commercial Managed Care - HMO | Source: Ambulatory Visit | Attending: Nurse Practitioner | Admitting: Nurse Practitioner

## 2021-07-09 DIAGNOSIS — Z124 Encounter for screening for malignant neoplasm of cervix: Secondary | ICD-10-CM | POA: Diagnosis present

## 2021-07-13 LAB — CYTOLOGY - PAP
Comment: NEGATIVE
Diagnosis: NEGATIVE
High risk HPV: NEGATIVE

## 2021-09-04 ENCOUNTER — Encounter (HOSPITAL_COMMUNITY): Payer: Self-pay

## 2021-09-04 ENCOUNTER — Ambulatory Visit (HOSPITAL_COMMUNITY)
Admission: RE | Admit: 2021-09-04 | Discharge: 2021-09-04 | Disposition: A | Discharge status: Home / Self Care | Payer: Commercial Managed Care - HMO | Source: Ambulatory Visit | Attending: Physician Assistant | Admitting: Physician Assistant

## 2021-09-04 VITALS — BP 124/85 | HR 81 | Temp 98.1°F | Resp 18

## 2021-09-04 DIAGNOSIS — R399 Unspecified symptoms and signs involving the genitourinary system: Secondary | ICD-10-CM

## 2021-09-04 DIAGNOSIS — R350 Frequency of micturition: Secondary | ICD-10-CM | POA: Diagnosis present

## 2021-09-04 DIAGNOSIS — R3915 Urgency of urination: Secondary | ICD-10-CM

## 2021-09-04 LAB — POCT URINALYSIS DIPSTICK, ED / UC
Bilirubin Urine: NEGATIVE
Glucose, UA: NEGATIVE mg/dL
Ketones, ur: NEGATIVE mg/dL
Leukocytes,Ua: NEGATIVE
Nitrite: NEGATIVE
Protein, ur: NEGATIVE mg/dL
Specific Gravity, Urine: 1.02 (ref 1.005–1.030)
Urobilinogen, UA: 0.2 mg/dL (ref 0.0–1.0)
pH: 7.5 (ref 5.0–8.0)

## 2021-09-04 NOTE — ED Triage Notes (Signed)
Patient states having urinary urgency and frequency for 3-4 days.

## 2021-09-04 NOTE — Discharge Instructions (Signed)
Your urine was normal.  We will contact you if your culture is positive and we need to arrange any treatment.  Monitor your MyChart for these results.  Make sure you are drinking lots of fluids.  I would avoid bladder irritants including NSAIDs (aspirin, ibuprofen/Advil, naproxen/Aleve) as well as caffeine.  Follow-up with urology if your symptoms or not improving.  Please call to schedule an appointment with them.  If anything worsens and you have severe pain, fever, nausea, vomiting you need to go to the emergency room immediately.

## 2021-09-04 NOTE — ED Provider Notes (Signed)
[facility]    CSN: [id number] Arrival date & time: 09/04/21  0813      History   Chief Complaint Chief Complaint  Patient presents with   Urinary Frequency    Possible uti - Entered by patient    HPI Barbara Rosario is a 56 y.o. female.   Patient presents today with a 3 to 4-day history of UTI symptoms.  Reports urgency, frequency, dysuria as well as urethral pain.  Denies any abdominal pain, pelvic pain, abnormal discharge, fever, nausea, vomiting.  Denies any recent antibiotic use.  She does report that she had a uterine biopsy approximately 6 weeks ago but denies additional urogenital procedure.  She denies history of UTI or nephrolithiasis that she has seen a urologist several years ago for workup of hematuria; this was normal.  Denies history of diabetes and does not take SGLT2 inhibitor.  Patient is postmenopausal.  She is able to perform daily activities despite symptoms.    Past Medical History:  Diagnosis Date   Essential hypertension 01/27/2015   Genital warts    Hyperlipemia    Lower extremity edema 09/02/2014   Palpitations 09/02/2014   PVC's (premature ventricular contractions) 08/18/2015   Tobacco abuse 08/18/2015   Tobacco dependence     Patient Active Problem List   Diagnosis Date Noted   Tobacco abuse 08/18/2015   PVC's (premature ventricular contractions) 08/18/2015   Essential hypertension 01/27/2015   OSA on CPAP 12/26/2014   Lower extremity edema 09/02/2014   Hyperlipidemia 06/13/2013    Past Surgical History:  Procedure Laterality Date   DILATION AND CURETTAGE OF UTERUS     mastiotympanostomy     nasal polyps      OB History   No obstetric history on file.      Home Medications    Prior to Admission medications   Medication Sig Start Date End Date Taking? Authorizing Provider  ciprofloxacin (CILOXAN) 0.3 % ophthalmic solution Place 3 drops into the right ear 2 (two) times daily. 05/18/19   [provider]  clobetasol  ointment (TEMOVATE) 0.05 % Apply to affected daily as needed 07/31/18   Dorothyann Peng, MD  magnesium gluconate (MAGONATE) 500 MG tablet Take 500 mg by mouth daily.    [provider]  propranolol (INDERAL) 10 MG tablet Take 1 tablet by mouth daily Patient taking differently: Take 1 tablet by mouth daily AS NEEDED 07/31/18   Dorothyann Peng, MD  RYBELSUS 14 MG TABS TAKE 1 TABLET BY MOUTH DAILY 10/08/19   Dorothyann Peng, MD  triamterene-hydrochlorothiazide (MAXZIDE-25) 37.5-25 MG tablet Take 1 tablet as needed by mouth (swelling).    [provider]    Family History Family History  Problem Relation Age of Onset   Cancer Father    Heart attack Paternal Grandfather    Heart disease Paternal Grandfather     Social History Social History   Tobacco Use   Smoking status: Former    Packs/day: 0.25    Years: 40.00    Total pack years: 10.00    Types: Cigarettes   Smokeless tobacco: Never   Tobacco comments:    0.5-1 ppd x 30 years, not yet ready to quit  Vaping Use   Vaping Use: Every day   Substances: Nicotine, Flavoring  Substance Use Topics   Alcohol use: No    Alcohol/week: 0.0 standard drinks of alcohol   Drug use: No     Allergies   Codeine and Sulfa antibiotics   Review of Systems  Review of Systems  Constitutional:  Negative for activity change, appetite change, fatigue and fever.  Gastrointestinal:  Negative for abdominal pain, diarrhea, nausea and vomiting.  Genitourinary:  Positive for dysuria, frequency and urgency. Negative for pelvic pain, vaginal bleeding, vaginal discharge and vaginal pain.     Physical Exam Triage Vital Signs ED Triage Vitals  Enc Vitals Group     BP 09/04/21 0833 124/85     Pulse Rate 09/04/21 0833 81     Resp 09/04/21 0833 18     Temp 09/04/21 0833 98.1 F (36.7 C)     Temp Source 09/04/21 0833 Oral     SpO2 09/04/21 0833 98 %     Weight --      Height --      Head Circumference --      Peak Flow --      Pain  Score 09/04/21 0830 8     Pain Loc --      Pain Edu? --      Excl. in GC? --    No data found.  Updated Vital Signs BP 124/85 (BP Location: Right Arm)   Pulse 81   Temp 98.1 F (36.7 C) (Oral)   Resp 18   SpO2 98%   Visual Acuity Right Eye Distance:   Left Eye Distance:   Bilateral Distance:    Right Eye Near:   Left Eye Near:    Bilateral Near:     Physical Exam Vitals reviewed.  Constitutional:      General: She is awake. She is not in acute distress.    Appearance: Normal appearance. She is well-developed. She is not ill-appearing.     Comments: Very pleasant female appears stated age in no acute distress sitting comfortably in exam room  HENT:     Head: Normocephalic and atraumatic.  Cardiovascular:     Rate and Rhythm: Normal rate and regular rhythm.     Heart sounds: Normal heart sounds, S1 normal and S2 normal. No murmur heard. Pulmonary:     Effort: Pulmonary effort is normal.     Breath sounds: Normal breath sounds. No wheezing, rhonchi or rales.     Comments: Clear to auscultation bilaterally Abdominal:     General: Bowel sounds are normal.     Palpations: Abdomen is soft.     Tenderness: There is abdominal tenderness in the suprapubic area. There is no right CVA tenderness, left CVA tenderness, guarding or rebound.     Comments: No evidence of acute abdomen on physical exam.  No CVA tenderness.  Mild tenderness over suprapubic region.  Psychiatric:        Behavior: Behavior is cooperative.      UC Treatments / Results  Labs (all labs ordered are listed, but only abnormal results are displayed) Labs Reviewed  POCT URINALYSIS DIPSTICK, ED / UC - Abnormal; Notable for the following components:      Result Value   Hgb urine dipstick TRACE (*)    All other components within normal limits  URINE CULTURE    EKG   Radiology No results found.  Procedures Procedures (including critical care time)  Medications Ordered in UC Medications - No data  to display  Initial Impression / Assessment and Plan / UC Course  I have reviewed the triage vital signs and the nursing notes.  Pertinent labs & imaging results that were available during my care of the patient were reviewed by me and considered in my medical decision making (  see chart for details).     UA was normal with only trace hemoglobin which is chronic related to benign microscopic hematuria for which patient has previously been evaluated by urology and had negative workup.  Discussed that her symptoms could be related to interstitial cystitis and recommend she push fluids and avoid bladder irritants.  We will send her urine for culture but defer antibiotics until results are available.  She is to monitor her MyChart but we will contact her if we need to arrange any additional treatment based on culture results.  Discussed that if her symptoms persist and she has negative culture results she should follow-up with urology and was given contact information for local provider with instruction to call to schedule an appointment.  Patient denies any concern for STIs so testing was deferred.  Discussed that if she has any worsening symptoms including abdominal pain, pelvic pain, fever, nausea, vomiting she needs to be seen immediately.  Final Clinical Impressions(s) / UC Diagnoses   Final diagnoses:  Urinary frequency  Urinary urgency  UTI symptoms     Discharge Instructions      Your urine was normal.  We will contact you if your culture is positive and we need to arrange any treatment.  Monitor your MyChart for these results.  Make sure you are drinking lots of fluids.  I would avoid bladder irritants including NSAIDs (aspirin, ibuprofen/Advil, naproxen/Aleve) as well as caffeine.  Follow-up with urology if your symptoms or not improving.  Please call to schedule an appointment with them.  If anything worsens and you have severe pain, fever, nausea, vomiting you need to go to the  emergency room immediately.     ED Prescriptions   None    PDMP not reviewed this encounter.   Jeani Hawking, PA-C 09/04/21 [id number]

## 2021-09-05 LAB — URINE CULTURE

## 2021-10-21 ENCOUNTER — Other Ambulatory Visit: Payer: Self-pay | Admitting: Otolaryngology

## 2021-10-21 DIAGNOSIS — H9211 Otorrhea, right ear: Secondary | ICD-10-CM

## 2021-11-02 ENCOUNTER — Ambulatory Visit
Admission: RE | Admit: 2021-11-02 | Discharge: 2021-11-02 | Disposition: A | Discharge status: Home / Self Care | Payer: Commercial Managed Care - HMO | Source: Ambulatory Visit | Attending: Otolaryngology | Admitting: Otolaryngology

## 2021-11-02 DIAGNOSIS — H9211 Otorrhea, right ear: Secondary | ICD-10-CM

## 2023-05-16 ENCOUNTER — Ambulatory Visit: Payer: Commercial Managed Care - HMO | Admitting: Dermatology

## 2023-05-16 ENCOUNTER — Encounter: Payer: Self-pay | Admitting: Dermatology

## 2023-05-16 VITALS — BP 125/85 | HR 91

## 2023-05-16 DIAGNOSIS — D492 Neoplasm of unspecified behavior of bone, soft tissue, and skin: Secondary | ICD-10-CM

## 2023-05-16 DIAGNOSIS — D485 Neoplasm of uncertain behavior of skin: Secondary | ICD-10-CM

## 2023-05-16 NOTE — Progress Notes (Signed)
   New Patient Visit   Subjective  Barbara Rosario is a 58 y.o. female who presents for the following: here to discuss removal of a lesion of concern on her left elbow, present for about 12 months. Thought to have resulted after getting an insect bite. Drains at times, previously I&D'ed.   The patient has spots, moles and lesions to be evaluated, some may be new or changing.  The following portions of the chart were reviewed this encounter and updated as appropriate: medications, allergies, medical history  Review of Systems:  No other skin or systemic complaints except as noted in HPI or Assessment and Plan.  Objective  Well appearing patient in no apparent distress; mood and affect are within normal limits.  A focused examination was performed of the following areas: elbow  Relevant exam findings are noted in the Assessment and Plan.  Left Elbow - Posterior 2.6 x 2.1 inflamed tender subcutaneous nodule with central umbilication and drainage   Assessment & Plan   Neoplasm of Skin- left elbow At today's visit, the patient was counseled regarding an erythematous, tender, and intermittently draining nodule on the left elbow, which developed following an insect bite. The lesion has not been previously treated aside from prior incision and drainage attempts. Differential diagnosis includes inflamed epidermal inclusion cyst, foreign body reaction, localized infection, non-melanoma skin cancer (NMSC), or other benign or malignant processes. Given the persistent nature of the lesion and lack of resolution, we discussed the plan to proceed with complete excision at the next appointment, including appropriate margins, both for definitive treatment and histopathologic evaluation. The patient understands the plan and agrees with the approach. NEOPLASM OF UNCERTAIN BEHAVIOR OF SKIN Left Elbow - Posterior Patient to return for an excision   Return in 29 days (on 06/14/2023) for 9AM for  excision.  I, Haig Levan, Surg Tech III, am acting as scribe for Deneise Finlay, MD.   Documentation: I have reviewed the above documentation for accuracy and completeness, and I agree with the above.  Deneise Finlay, MD

## 2023-06-14 ENCOUNTER — Encounter: Payer: Self-pay | Admitting: Dermatology

## 2023-06-14 ENCOUNTER — Ambulatory Visit: Admitting: Dermatology

## 2023-06-14 VITALS — BP 131/86 | HR 100

## 2023-06-14 DIAGNOSIS — D485 Neoplasm of uncertain behavior of skin: Secondary | ICD-10-CM

## 2023-06-14 DIAGNOSIS — C4492 Squamous cell carcinoma of skin, unspecified: Secondary | ICD-10-CM

## 2023-06-14 DIAGNOSIS — C44629 Squamous cell carcinoma of skin of left upper limb, including shoulder: Secondary | ICD-10-CM

## 2023-06-14 NOTE — Progress Notes (Signed)
 Follow-Up Visit   Subjective  Barbara Rosario is a 58 y.o. female who presents for the following: Excision of a neoplasm of skin, now found to be atypical squamous proliferation (cannot rule out SCC) of the left elbow. 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   The following portions of the chart were reviewed this encounter and updated as appropriate: medications, allergies, medical history  Review of Systems:  No other skin or systemic complaints except as noted in HPI or Assessment and Plan.  Objective  Well appearing patient in no apparent distress; mood and affect are within normal limits.  A focused examination was performed of the following areas: Left elbow Relevant physical exam findings are noted in the Assessment and Plan.   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 Skin excision  Excision method:  elliptical Lesion length (cm):  2.1 Lesion width (cm):  2.3 Margin per side (cm):  0.5 Total excision diameter (cm):  3.3 Informed consent: discussed and consent obtained   Timeout: patient name, date of birth, surgical site, and procedure verified   Procedure prep:  Patient was prepped and draped in usual sterile fashion Prep type:  Chlorhexidine Anesthesia: the lesion was anesthetized in a standard fashion   Anesthetic:  1% lidocaine w/ epinephrine 1-100,000 buffered w/ 8.4% NaHCO3 Instrument used: #15 blade   Hemostasis achieved with: pressure and electrodesiccation   Hemostasis achieved with comment:  3.0 vicryl with dermabond and steri strips Outcome: patient tolerated procedure well with no complications   Post-procedure details: sterile dressing applied and wound care instructions given   Dressing type: bandage and pressure dressing   Additional details:  Final length 6.8  Skin repair Complexity:  Complex Final  length (cm):  6.8 Informed consent: discussed and consent obtained   Timeout: patient name, date of birth, surgical site, and procedure verified   Procedure prep:  Patient was prepped and draped in usual sterile fashion Prep type:  Chlorhexidine Anesthesia: the lesion was anesthetized in a standard fashion   Anesthetic:  1% lidocaine w/ epinephrine 1-100,000 buffered w/ 8.4% NaHCO3 Reason for type of repair: reduce tension to allow closure, preserve normal anatomy, preserve normal anatomical and functional relationships, avoid adjacent structures and allow side-to-side closure without requiring a flap or graft   Undermining: area extensively undermined   Subcutaneous layers (deep stitches):  Suture size:  3-0 Suture type: Vicryl (polyglactin 910)   Stitches:  Buried vertical mattress Fine/surface layer approximation (top stitches):  Suture type: cyanoacrylate tissue glue   Hemostasis achieved with: suture, pressure and electrodesiccation Outcome: patient tolerated procedure well with no complications   Post-procedure details: sterile dressing applied and wound care instructions given   Dressing type: bandage and pressure dressing   Specimen 1 - Surgical pathology Differential Diagnosis: R/O cyst vs lipoma vs nmsc vs mcc vs other  Check Margins: No  The surgical wound was then cleaned, prepped, and re-anesthetized as above. Wound edges were undermined extensively along at least one entire edge and at a distance equal to or greater than the width of the defect (see wound defect size above) in order to achieve closure and decrease wound tension and anatomic distortion. Redundant tissue repair including standing cone removal was performed. Hemostasis was achieved with electrocautery. Subcutaneous and epidermal tissues were approximated with the above sutures. The surgical site was  then lightly scrubbed with sterile, saline-soaked gauze. Steri-strips were applied, and the area was then bandaged  using Vaseline ointment, non-adherent gauze, gauze pads, and tape to provide an adequate pressure dressing. The patient tolerated the procedure well, was given detailed written and verbal wound care instructions, and was discharged in good condition.   The patient will follow-up: PRN.  Return if symptoms worsen or fail to improve.  I, Wilson Hasten, CMA, am acting as scribe for Deneise Finlay, MD.   Documentation: I have reviewed the above documentation for accuracy and completeness, and I agree with the above.  Deneise Finlay, MD

## 2023-06-14 NOTE — Patient Instructions (Signed)

## 2023-06-21 LAB — SURGICAL PATHOLOGY

## 2023-06-22 ENCOUNTER — Ambulatory Visit: Payer: Self-pay | Admitting: Dermatology

## 2023-06-28 ENCOUNTER — Encounter (HOSPITAL_BASED_OUTPATIENT_CLINIC_OR_DEPARTMENT_OTHER): Payer: Self-pay | Admitting: Emergency Medicine

## 2023-06-28 ENCOUNTER — Emergency Department (HOSPITAL_BASED_OUTPATIENT_CLINIC_OR_DEPARTMENT_OTHER)
Admission: EM | Admit: 2023-06-28 | Discharge: 2023-06-28 | Disposition: A | Discharge status: Home / Self Care | Attending: Emergency Medicine | Admitting: Emergency Medicine

## 2023-06-28 ENCOUNTER — Other Ambulatory Visit: Payer: Self-pay

## 2023-06-28 DIAGNOSIS — X58XXXA Exposure to other specified factors, initial encounter: Secondary | ICD-10-CM | POA: Insufficient documentation

## 2023-06-28 DIAGNOSIS — S51012A Laceration without foreign body of left elbow, initial encounter: Secondary | ICD-10-CM | POA: Diagnosis not present

## 2023-06-28 DIAGNOSIS — S59902A Unspecified injury of left elbow, initial encounter: Secondary | ICD-10-CM | POA: Diagnosis present

## 2023-06-28 NOTE — ED Triage Notes (Signed)
 Had lesion removed 2 weeks ago. States stitches popped open today. Bleeding control during triage. Denies pain.

## 2023-06-28 NOTE — ED Provider Notes (Signed)
 [place] EMERGENCY DEPARTMENT AT [facility] Provider Note   CSN: [id number] Arrival date & time: 06/28/23  1103     History  Chief Complaint  Patient presents with   Wound Check    Barbara Rosario is a 58 y.o. female.  58 year old female presents with concern for wound on the left elbow.  Patient states that she went to the dermatologist 2 weeks ago and had deep sutures placed with Steri-Strips and Dermabond.  The Steri-Strips came off a few days ago and the wound appeared to be healing well.  Patient went to the chiropractor today, crossed her arms across her chest and when he applied pressure to her body her wound popped open.  She reports passing little blood and clots from the area.  She applied butterfly Band-Aids to the area and came to the ER for evaluation.  Bleeding is currently controlled.  No other complaints or concerns.       Home Medications Prior to Admission medications   Medication Sig Start Date End Date Taking? Authorizing Provider  ciprofloxacin (CILOXAN) 0.3 % ophthalmic solution Place 3 drops into the right ear 2 (two) times daily. 05/18/19   [provider]  clobetasol  ointment (TEMOVATE ) 0.05 % Apply to affected daily as needed 07/31/18   Cleave Curling, MD  magnesium gluconate (MAGONATE) 500 MG tablet Take 500 mg by mouth daily.    [provider]  propranolol  (INDERAL ) 10 MG tablet Take 1 tablet by mouth daily Patient taking differently: Take 1 tablet by mouth daily AS NEEDED 07/31/18   Cleave Curling, MD  RYBELSUS  14 MG TABS TAKE 1 TABLET BY MOUTH DAILY 10/08/19   Cleave Curling, MD  triamterene -hydrochlorothiazide (MAXZIDE-25) 37.5-25 MG tablet Take 1 tablet as needed by mouth (swelling).    [provider]      Allergies    Codeine and Sulfa antibiotics    Review of Systems   Review of Systems Negative except as per HPI Physical Exam Updated Vital Signs BP (!) 153/98 (BP Location: Right Arm)   Pulse 92   Temp  97.7 F (36.5 C)   Resp 16   SpO2 97%  Physical Exam Vitals and nursing note reviewed.  Constitutional:      General: She is not in acute distress.    Appearance: She is well-developed. She is not diaphoretic.  HENT:     Head: Normocephalic and atraumatic.  Pulmonary:     Effort: Pulmonary effort is normal.  Musculoskeletal:     Comments: Area of wound dehiscence to left elbow, no active bleeding.  Steri-Strips in place which approximate wound fairly well.  Skin:    General: Skin is warm and dry.  Neurological:     Mental Status: She is alert and oriented to person, place, and time.  Psychiatric:        Behavior: Behavior normal.     ED Results / Procedures / Treatments   Labs (all labs ordered are listed, but only abnormal results are displayed) Labs Reviewed - No data to display  EKG None  Radiology No results found.  Procedures .Laceration Repair  Date/Time: 06/28/2023 1:30 PM  Performed by: Darlis Eisenmenger, PA-C Authorized by: Darlis Eisenmenger, PA-C   Consent:    Consent obtained:  Verbal   Consent given by:  Patient   Risks, benefits, and alternatives were discussed: yes     Risks discussed:  Infection, need for additional repair, poor cosmetic result and poor wound healing   Alternatives discussed:  No treatment Universal protocol:    Patient identity confirmed:  Verbally with patient Anesthesia:    Anesthesia method:  None Laceration details:    Location:  Shoulder/arm   Shoulder/arm location:  L lower arm   Length (cm):  2.5 Treatment:    Area cleansed with:  Saline   Amount of cleaning:  Standard   Irrigation solution:  Sterile saline   Debridement:  None   Undermining:  None Skin repair:    Repair method:  Steri-Strips   Number of Steri-Strips:  3 Approximation:    Approximation:  Loose Repair type:    Repair type:  Simple Post-procedure details:    Dressing:  Bulky dressing   Procedure completion:  Tolerated     Medications Ordered in  ED Medications - No data to display  ED Course/ Medical Decision Making/ A&P                                 Medical Decision Making  58 year old female presents for wound check to left elbow as above.  Patient has applied butterfly bandages to the area prior to arrival.  Wound edges are retracted.  Explained to patient wound will need to heal by secondary intention.  She has done a good job managing this wound at home however request to have the butterfly bandages removed and Steri-Strips placed to try and hold the wound better.  Explained to patient the wound will be left open so that it can drain but the Steri-Strips will hopefully help limit the gaping of the wound.  Recommend follow-up with her dermatologist for recheck of her wound and scar revision if needed.        Final Clinical Impression(s) / ED Diagnoses Final diagnoses:  Laceration of left elbow, initial encounter    Rx / DC Orders ED Discharge Orders     None         Darlis Eisenmenger, PA-C 06/28/23 1345    Deatra Face, MD 07/05/23 0030

## 2023-06-28 NOTE — Discharge Instructions (Addendum)
 Follow-up with your dermatologist for recheck.

## 2023-07-19 ENCOUNTER — Ambulatory Visit: Admitting: Dermatology
